# Patient Record
Sex: Male | Born: 1970 | Race: White | Hispanic: No | Marital: Married | Smoking: Never smoker
Health system: Southern US, Community
[De-identification: ages and names within clinical notes are randomized; demographics above are authoritative.]

## PROBLEM LIST (undated history)

## (undated) DIAGNOSIS — M1990 Unspecified osteoarthritis, unspecified site: Secondary | ICD-10-CM

## (undated) DIAGNOSIS — E049 Nontoxic goiter, unspecified: Secondary | ICD-10-CM

## (undated) DIAGNOSIS — E039 Hypothyroidism, unspecified: Secondary | ICD-10-CM

## (undated) HISTORY — PX: BACK SURGERY: SHX140

## (undated) HISTORY — DX: Nontoxic goiter, unspecified: E04.9

## (undated) HISTORY — DX: Unspecified osteoarthritis, unspecified site: M19.90

## (undated) HISTORY — DX: Hypothyroidism, unspecified: E03.9

## (undated) HISTORY — PX: PILONIDAL CYST EXCISION: SHX744

---

## 2000-07-15 ENCOUNTER — Encounter: Payer: Self-pay | Admitting: Rheumatology

## 2000-07-15 ENCOUNTER — Ambulatory Visit (HOSPITAL_COMMUNITY): Admission: RE | Admit: 2000-07-15 | Discharge: 2000-07-15 | Payer: Self-pay | Admitting: Rheumatology

## 2002-12-15 ENCOUNTER — Emergency Department (HOSPITAL_COMMUNITY): Admission: EM | Admit: 2002-12-15 | Discharge: 2002-12-15 | Payer: Self-pay | Admitting: Emergency Medicine

## 2004-11-08 ENCOUNTER — Ambulatory Visit (HOSPITAL_COMMUNITY): Admission: RE | Admit: 2004-11-08 | Discharge: 2004-11-08 | Payer: Self-pay | Admitting: Internal Medicine

## 2005-11-20 ENCOUNTER — Emergency Department (HOSPITAL_COMMUNITY): Admission: EM | Admit: 2005-11-20 | Discharge: 2005-11-20 | Payer: Self-pay | Admitting: Emergency Medicine

## 2005-11-27 ENCOUNTER — Emergency Department (HOSPITAL_COMMUNITY): Admission: EM | Admit: 2005-11-27 | Discharge: 2005-11-27 | Payer: Self-pay | Admitting: Emergency Medicine

## 2006-09-27 ENCOUNTER — Ambulatory Visit (HOSPITAL_COMMUNITY): Admission: RE | Admit: 2006-09-27 | Discharge: 2006-09-27 | Payer: Self-pay | Admitting: Internal Medicine

## 2008-04-14 ENCOUNTER — Ambulatory Visit (HOSPITAL_COMMUNITY): Admission: RE | Admit: 2008-04-14 | Discharge: 2008-04-14 | Payer: Self-pay | Admitting: Internal Medicine

## 2008-12-14 ENCOUNTER — Emergency Department (HOSPITAL_COMMUNITY): Admission: EM | Admit: 2008-12-14 | Discharge: 2008-12-14 | Payer: Self-pay | Admitting: Emergency Medicine

## 2010-03-07 ENCOUNTER — Ambulatory Visit (HOSPITAL_COMMUNITY): Admission: RE | Admit: 2010-03-07 | Discharge: 2010-03-07 | Payer: Self-pay | Admitting: Endocrinology

## 2010-04-26 ENCOUNTER — Ambulatory Visit (HOSPITAL_COMMUNITY)
Admission: RE | Admit: 2010-04-26 | Discharge: 2010-04-26 | Payer: Self-pay | Source: Home / Self Care | Admitting: Internal Medicine

## 2010-09-13 ENCOUNTER — Ambulatory Visit (HOSPITAL_COMMUNITY)
Admission: RE | Admit: 2010-09-13 | Discharge: 2010-09-13 | Disposition: A | Discharge status: Home / Self Care | Payer: BC Managed Care – PPO | Source: Ambulatory Visit | Attending: Neurosurgery | Admitting: Neurosurgery

## 2010-09-13 DIAGNOSIS — R262 Difficulty in walking, not elsewhere classified: Secondary | ICD-10-CM | POA: Insufficient documentation

## 2010-09-13 DIAGNOSIS — IMO0001 Reserved for inherently not codable concepts without codable children: Secondary | ICD-10-CM | POA: Insufficient documentation

## 2010-09-13 DIAGNOSIS — M6281 Muscle weakness (generalized): Secondary | ICD-10-CM | POA: Insufficient documentation

## 2010-09-13 DIAGNOSIS — M545 Low back pain, unspecified: Secondary | ICD-10-CM | POA: Insufficient documentation

## 2010-09-20 ENCOUNTER — Ambulatory Visit (HOSPITAL_COMMUNITY)
Admission: RE | Admit: 2010-09-20 | Discharge: 2010-09-20 | Disposition: A | Discharge status: Home / Self Care | Payer: BC Managed Care – PPO | Source: Ambulatory Visit | Attending: *Deleted | Admitting: *Deleted

## 2010-09-22 ENCOUNTER — Inpatient Hospital Stay (HOSPITAL_COMMUNITY)
Admission: RE | Admit: 2010-09-22 | Discharge: 2010-09-22 | Disposition: A | Discharge status: Home / Self Care | Payer: BC Managed Care – PPO | Source: Ambulatory Visit | Attending: Physical Therapy | Admitting: Physical Therapy

## 2010-09-26 ENCOUNTER — Ambulatory Visit (HOSPITAL_COMMUNITY)
Admission: RE | Admit: 2010-09-26 | Discharge: 2010-09-26 | Disposition: A | Discharge status: Home / Self Care | Payer: BC Managed Care – PPO | Source: Ambulatory Visit | Attending: Neurosurgery | Admitting: Neurosurgery

## 2010-09-26 DIAGNOSIS — M6281 Muscle weakness (generalized): Secondary | ICD-10-CM | POA: Insufficient documentation

## 2010-09-26 DIAGNOSIS — M545 Low back pain, unspecified: Secondary | ICD-10-CM | POA: Insufficient documentation

## 2010-09-26 DIAGNOSIS — IMO0001 Reserved for inherently not codable concepts without codable children: Secondary | ICD-10-CM | POA: Insufficient documentation

## 2010-09-26 DIAGNOSIS — R262 Difficulty in walking, not elsewhere classified: Secondary | ICD-10-CM | POA: Insufficient documentation

## 2010-09-28 ENCOUNTER — Ambulatory Visit (HOSPITAL_COMMUNITY): Payer: BC Managed Care – PPO | Admitting: *Deleted

## 2010-10-03 ENCOUNTER — Ambulatory Visit (HOSPITAL_COMMUNITY): Payer: BC Managed Care – PPO

## 2010-10-06 ENCOUNTER — Ambulatory Visit (HOSPITAL_COMMUNITY)
Admission: RE | Admit: 2010-10-06 | Discharge: 2010-10-06 | Disposition: A | Discharge status: Home / Self Care | Payer: BC Managed Care – PPO | Source: Ambulatory Visit | Admitting: Physical Therapy

## 2010-10-11 ENCOUNTER — Ambulatory Visit (HOSPITAL_COMMUNITY)
Admission: RE | Admit: 2010-10-11 | Discharge: 2010-10-11 | Disposition: A | Discharge status: Home / Self Care | Payer: BC Managed Care – PPO | Source: Ambulatory Visit | Admitting: Physical Therapy

## 2010-10-13 ENCOUNTER — Ambulatory Visit (HOSPITAL_COMMUNITY)
Admission: RE | Admit: 2010-10-13 | Discharge: 2010-10-13 | Disposition: A | Discharge status: Home / Self Care | Payer: BC Managed Care – PPO | Source: Ambulatory Visit | Admitting: Physical Therapy

## 2010-10-19 ENCOUNTER — Ambulatory Visit (HOSPITAL_COMMUNITY)
Admission: RE | Admit: 2010-10-19 | Discharge: 2010-10-19 | Disposition: A | Discharge status: Home / Self Care | Payer: BC Managed Care – PPO | Source: Ambulatory Visit | Attending: Internal Medicine | Admitting: Internal Medicine

## 2010-11-23 ENCOUNTER — Other Ambulatory Visit (HOSPITAL_COMMUNITY): Payer: Self-pay | Admitting: Pulmonary Disease

## 2010-11-23 ENCOUNTER — Ambulatory Visit (HOSPITAL_COMMUNITY)
Admission: RE | Admit: 2010-11-23 | Discharge: 2010-11-23 | Disposition: A | Discharge status: Home / Self Care | Payer: BC Managed Care – PPO | Source: Ambulatory Visit | Attending: Pulmonary Disease | Admitting: Pulmonary Disease

## 2010-11-23 DIAGNOSIS — R109 Unspecified abdominal pain: Secondary | ICD-10-CM | POA: Insufficient documentation

## 2010-12-22 ENCOUNTER — Ambulatory Visit (INDEPENDENT_AMBULATORY_CARE_PROVIDER_SITE_OTHER): Payer: BC Managed Care – PPO | Admitting: Urgent Care

## 2010-12-22 ENCOUNTER — Encounter: Payer: Self-pay | Admitting: Urgent Care

## 2010-12-22 DIAGNOSIS — K5909 Other constipation: Secondary | ICD-10-CM

## 2010-12-22 DIAGNOSIS — K5900 Constipation, unspecified: Secondary | ICD-10-CM

## 2010-12-22 DIAGNOSIS — R109 Unspecified abdominal pain: Secondary | ICD-10-CM | POA: Insufficient documentation

## 2010-12-22 NOTE — Patient Instructions (Signed)
Continue daily fiber supplement, metamucil, benefiber, etc Add ALIGN probiotic daily  Continue MIralax 17 grams daily for constipation-hold if loose stools Return stool specimen here ASAP  Constipation in Adults Constipation is having fewer than 2 bowel movements per week. Usually, the stools are hard. As we grow older, constipation is more common. If you try to fix constipation with laxatives, the problem may get worse. This is because laxatives taken over a long period of time make the colon muscles weaker. A low-fiber diet, not taking in enough fluids, and taking some medicines may make these problems worse. MEDICATIONS THAT MAY CAUSE CONSTIPATION  Water pills (diuretics).  Calcium channel blockers (used to control blood pressure and for the heart).   Certain pain medicines (narcotics).   Anticholinergics.  Anti-inflammatory agents.   Antacids that contain aluminum.   DISEASES THAT CONTRIBUTE TO CONSTIPATION  Diabetes.  Parkinson's disease.   Dementia.   Stroke.  Depression.   Illnesses that cause problems with salt and water metabolism.   HOME CARE INSTRUCTIONS  Constipation is usually best cared for without medicines. Increasing dietary fiber and eating more fruits and vegetables is the best way to manage constipation.   Slowly increase fiber intake to 25 to 38 grams per day. Whole grains, fruits, vegetables, and legumes are good sources of fiber. A dietitian can further help you incorporate high-fiber foods into your diet.   Drink enough water and fluids to keep your urine clear or pale yellow.   A fiber supplement may be added to your diet if you cannot get enough fiber from foods.   Increasing your activities also helps improve regularity.   Suppositories, as suggested by your caregiver, will also help. If you are using antacids, such as aluminum or calcium containing products, it will be helpful to switch to products containing magnesium if your caregiver says it  is okay.   If you have been given a liquid injection (enema) today, this is only a temporary measure. It should not be relied on for treatment of longstanding (chronic) constipation.   Stronger measures, such as magnesium sulfate, should be avoided if possible. This may cause uncontrollable diarrhea. Using magnesium sulfate may not allow you time to make it to the bathroom.  SEEK IMMEDIATE MEDICAL CARE IF:  There is bright red blood in the stool.   The constipation stays for more than 4 days.   There is belly (abdominal) or rectal pain.   You do not seem to be getting better.   You have any questions or concerns.  MAKE SURE YOU:  Understand these instructions.   Will watch your condition.   Will get help right away if you are not doing well or get worse.  Document Released: 03/09/2004 Document Re-Released: 09/05/2009 [facility] Patient Information 2011 [place], [place].

## 2010-12-22 NOTE — Progress Notes (Signed)
agree

## 2010-12-22 NOTE — Progress Notes (Signed)
Referring Provider: Carmie End), MD Primary Care Physician:  Carylon Perches, MD Primary Gastroenterologist:  Dr. Darrick Penna  Chief Complaint  Patient presents with  . Abdominal Pain    HPI:  Gordon Haley is a 40 y.o. male here as a referral from Dr. Ouida Sills for C/o gassy & abd bloating x 1 month.  CT 11/23/10->inspissated stool in colon.  Was having BM q 3days, had always been very regular.  Now back to regular daily BM.  Has been taking high fiber & miralax daily now feeling better.  Also tried mag citrate.  QAM bloating in AM, eases off throughout the day.  Denies N/V.  Started prilosec 20mg  daily-not sure if this has helped.  Back surgery 11/11 on aleve BID & vicodin that worsened constipation.  Off x 3 wks.  Now, advil 1-2 per day for HA.  Denies rectal bleeding or melena.  Started synthroid  2 weeks ago.  Wt stable.  Appetite ok.  Pain better w/ defecation.  Worst first 2 hrs AM.  Not associated w/ eating.   Past Medical History  Diagnosis Date  . Hypothyroidism   . Goiter     Past Surgical History  Procedure Date  . Back surgery   . Pilonidal cyst excision     Current Outpatient Prescriptions  Medication Sig Dispense Refill  . ibuprofen (ADVIL,MOTRIN) 200 MG tablet Take 200 mg by mouth every 6 (six) hours as needed.        Marland Kitchen levothyroxine (SYNTHROID, LEVOTHROID) 50 MCG tablet       . Nutritional Supplements (CHOICE DM FIBER-BURST PO) Take by mouth.        Marland Kitchen omeprazole (PRILOSEC) 20 MG capsule       . Simethicone (GAS RELIEF PO) Take by mouth.          Allergies as of 12/22/2010  . (No Known Allergies)    Family History  Problem Relation Age of Onset  . Cancer Father     bladder  . Lung cancer Mother   . Diabetes Brother     History   Social History  . Marital Status: Married    Spouse Name: N/A    Number of Children: 2  . Years of Education: N/A   Occupational History  . maitenance     [employer]   Social History Main Topics  . Smoking status:  Never Smoker   . Smokeless tobacco: Not on file  . Alcohol Use: Yes     occ couple beer/mo  . Drug Use: No  . Sexually Active: Not on file   Other Topics Concern  . Not on file   Social History Narrative   2 children 7/10-healthy    Review of Systems: Gen: Denies any fever, chills, sweats, anorexia, fatigue, weakness, malaise, weight loss, and sleep disorder HEENT:  Sinus drainage qAM, HAs CV: Denies chest pain, angina, palpitations, syncope, orthopnea, PND, peripheral edema, and claudication. Resp: Denies dyspnea at rest, dyspnea with exercise, cough, sputum, wheezing, coughing up blood, and pleurisy. GI: Denies vomiting blood, jaundice, and fecal incontinence.   Denies dysphagia or odynophagia. GU : Denies urinary burning, blood in urine, urinary frequency, urinary hesitancy, nocturnal urination, and urinary incontinence. MS: Denies joint pain, limitation of movement, and swelling, stiffness, low back pain, extremity pain. Denies muscle weakness, cramps, atrophy.  Derm: Denies rash, itching, dry skin, hives, moles, warts, or unhealing ulcers.  Psych: Denies depression, anxiety, memory loss, suicidal ideation, hallucinations, paranoia, and confusion.Stress w/ job change. Heme:  Denies bruising, bleeding, and enlarged lymph nodes.  Physical Exam: BP 143/79  Pulse 79  Temp(Src) 97.8 F (36.6 C) (Temporal)  Ht 5\' 11"  (1.803 m)  Wt 172 lb 3.2 oz (78.109 kg)  BMI 24.02 kg/m2 General:   Alert,  Well-developed, well-nourished, pleasant and cooperative in NAD Head:  Normocephalic and atraumatic. Eyes:  Sclera clear, no icterus.   Conjunctiva pink. Ears:  Normal auditory acuity. Nose:  No deformity, discharge,  or lesions. Mouth:  No deformity or lesions, dentition normal. Neck:  Supple; no masses or thyromegaly. Lungs:  Clear throughout to auscultation.   No wheezes, crackles, or rhonchi. No acute distress. Heart:  Regular rate and rhythm; no murmurs, clicks, rubs,  or  gallops. Abdomen:  Soft, & nondistended. Mild tenderness @ umbilicus.  No masses, hepatosplenomegaly or hernias noted. Normal bowel sounds, without guarding, and without rebound.   Rectal:  Deferred until time of colonoscopy.   Msk:  Symmetrical without gross deformities. Normal posture. Pulses:  Normal pulses noted. Extremities:  Without clubbing or edema. Neurologic:  Alert and  oriented x4;  grossly normal neurologically. Skin:  Intact without significant lesions or rashes. Cervical Nodes:  No significant cervical adenopathy. Psych:  Alert and cooperative. Normal mood and affect.

## 2010-12-22 NOTE — Assessment & Plan Note (Addendum)
Gordon Haley is a 40 y.o. caucasian male w/ recent chronic constipation in the setting of newly diagnosed thyroid disease & narcotic pain med use.  Suspect this may be culprit of abdominal pain since he has had symptomatic improvement w/ normalization of his BMs. No alarm features.   Check iFOBT.  If positive, will need colonoscopy. Follow up w/ endocrinology re TSH/thyroid Constipation literature Continue daily fiber supplement, metamucil, benefiber, etc Add ALIGN probiotic daily  Continue MIralax 17 grams daily for constipation-hold if loose stools

## 2010-12-25 NOTE — Progress Notes (Signed)
Cc to PCP 

## 2010-12-26 NOTE — Progress Notes (Signed)
LMOM to call.

## 2010-12-26 NOTE — Progress Notes (Signed)
Please call pt & have him return iFOBT. Thanks

## 2010-12-28 ENCOUNTER — Ambulatory Visit: Payer: BC Managed Care – PPO | Admitting: Urgent Care

## 2010-12-28 DIAGNOSIS — R109 Unspecified abdominal pain: Secondary | ICD-10-CM

## 2010-12-28 LAB — IFOBT (OCCULT BLOOD): IFOBT: NEGATIVE

## 2011-01-17 ENCOUNTER — Encounter: Payer: Self-pay | Admitting: Gastroenterology

## 2011-02-08 ENCOUNTER — Ambulatory Visit: Payer: BC Managed Care – PPO | Admitting: Gastroenterology

## 2011-02-08 ENCOUNTER — Telehealth: Payer: Self-pay | Admitting: Gastroenterology

## 2011-02-09 NOTE — Telephone Encounter (Signed)
noted 

## 2013-01-26 ENCOUNTER — Other Ambulatory Visit (HOSPITAL_COMMUNITY): Payer: Self-pay | Admitting: Otolaryngology

## 2013-01-26 DIAGNOSIS — E079 Disorder of thyroid, unspecified: Secondary | ICD-10-CM

## 2013-02-05 ENCOUNTER — Ambulatory Visit (HOSPITAL_COMMUNITY): Payer: BC Managed Care – PPO | Attending: Otolaryngology

## 2013-02-05 ENCOUNTER — Ambulatory Visit (HOSPITAL_COMMUNITY): Admission: RE | Admit: 2013-02-05 | Payer: BC Managed Care – PPO | Source: Ambulatory Visit

## 2013-03-05 ENCOUNTER — Ambulatory Visit (HOSPITAL_COMMUNITY)
Admission: RE | Admit: 2013-03-05 | Discharge: 2013-03-05 | Disposition: A | Discharge status: Home / Self Care | Payer: BC Managed Care – PPO | Source: Ambulatory Visit | Attending: Otolaryngology | Admitting: Otolaryngology

## 2013-03-05 DIAGNOSIS — E079 Disorder of thyroid, unspecified: Secondary | ICD-10-CM

## 2013-03-05 DIAGNOSIS — E049 Nontoxic goiter, unspecified: Secondary | ICD-10-CM | POA: Insufficient documentation

## 2013-03-05 MED ORDER — LIDOCAINE HCL (PF) 2 % IJ SOLN
INTRAMUSCULAR | Status: AC
  Filled 2013-03-05: qty 10

## 2013-03-05 MED ORDER — LIDOCAINE HCL (PF) 2 % IJ SOLN
10.0000 mL | Freq: Once | INTRAMUSCULAR | Status: AC
  Administered 2013-03-05: 10 mL
  Filled 2013-03-05: qty 10

## 2013-03-05 NOTE — Progress Notes (Signed)
Pt for right thyroid biopsy with Dr.Boles. Pt tolerating procedure no signs of distress.

## 2013-03-05 NOTE — Procedures (Signed)
PreOperative Dx: RIGHT thyroid mass Postoperative Dx: RIGHT thyroid mass Procedure:   US guided FNA of RIGHT thyroid nodule Radiologist:  Tyron Russell Anesthesia:  1 ml of 2 lidocaine Specimen:  FNA x 3  EBL:   None Complications: None

## 2013-09-07 ENCOUNTER — Encounter (INDEPENDENT_AMBULATORY_CARE_PROVIDER_SITE_OTHER): Payer: Self-pay | Admitting: General Surgery

## 2013-09-07 ENCOUNTER — Ambulatory Visit (INDEPENDENT_AMBULATORY_CARE_PROVIDER_SITE_OTHER): Payer: PRIVATE HEALTH INSURANCE | Admitting: General Surgery

## 2013-09-07 VITALS — BP 142/90 | HR 78 | Temp 99.0°F | Resp 14 | Ht 71.0 in | Wt 174.2 lb

## 2013-09-07 DIAGNOSIS — K6289 Other specified diseases of anus and rectum: Secondary | ICD-10-CM

## 2013-09-07 NOTE — Progress Notes (Signed)
Chief complaint: Perianal irritation and discomfort  History: Patient is a generally healthy 43 year old male. I know him do to a pilonidal cyst excision 18 years ago. He states that about 2 weeks ago he developed an area of what felt like a skin eructation and tenderness just anterior to his anus. He initially tried some barrier cream is without success. Cornstarch was recommended and he's been using this for about a week and feels he is 50% better than he was. He still feels that the skin is irritated or tender when the cornstarch is soft. He has not had any deep pain or drainage. He says it did feel somewhat swollen to him initially but not now. No previous history of any perianal infections. No pain with bowel movements or bleeding  Past Medical History  Diagnosis Date  . Hypothyroidism   . Goiter   . Arthritis    Past Surgical History  Procedure Laterality Date  . Back surgery    . Pilonidal cyst excision     Exam: BP 142/90  Pulse 78  Temp(Src) 99 F (37.2 C) (Temporal)  Resp 14  Ht 5\' 11"  (1.803 m)  Wt 174 lb 3.2 oz (79.017 kg)  BMI 24.31 kg/m2 General: Healthy-appearing Caucasian male in no distress Current exam Limited to anorectal exam. There may be some minimal erythema to the skin just anterior to the anus a centimeter or 2. No fullness or mass or tenderness. No skin openings were drainage.  Assessment and plan: Perianal discomfort. Etiology is not clear. He may have had some skin irritation which is improving. This could conceivably have been a perianal abscess that is resolving spontaneously. I cannot really find anything specific on exam today and he is steadily improving with his current regimen. I recommended continuing this and to contact me if he feels worse at any point or if it has not resolved in the next 2 weeks.

## 2015-02-01 ENCOUNTER — Other Ambulatory Visit (HOSPITAL_COMMUNITY): Payer: Self-pay | Admitting: Endocrinology

## 2015-02-01 DIAGNOSIS — E042 Nontoxic multinodular goiter: Secondary | ICD-10-CM

## 2015-02-09 ENCOUNTER — Ambulatory Visit (HOSPITAL_COMMUNITY)
Admission: RE | Admit: 2015-02-09 | Discharge: 2015-02-09 | Disposition: A | Discharge status: Home / Self Care | Payer: PRIVATE HEALTH INSURANCE | Source: Ambulatory Visit | Attending: Endocrinology | Admitting: Endocrinology

## 2015-02-09 DIAGNOSIS — E042 Nontoxic multinodular goiter: Secondary | ICD-10-CM

## 2015-02-09 DIAGNOSIS — E079 Disorder of thyroid, unspecified: Secondary | ICD-10-CM | POA: Insufficient documentation

## 2015-11-23 ENCOUNTER — Ambulatory Visit: Payer: Self-pay | Admitting: Family Medicine

## 2015-12-02 ENCOUNTER — Encounter: Payer: Self-pay | Admitting: Family Medicine

## 2015-12-02 ENCOUNTER — Ambulatory Visit (INDEPENDENT_AMBULATORY_CARE_PROVIDER_SITE_OTHER): Payer: PRIVATE HEALTH INSURANCE | Admitting: Family Medicine

## 2015-12-02 VITALS — BP 114/77 | HR 71 | Temp 97.0°F | Ht 71.0 in | Wt 173.0 lb

## 2015-12-02 DIAGNOSIS — Z7689 Persons encountering health services in other specified circumstances: Secondary | ICD-10-CM

## 2015-12-02 DIAGNOSIS — Z0000 Encounter for general adult medical examination without abnormal findings: Secondary | ICD-10-CM | POA: Diagnosis not present

## 2015-12-02 DIAGNOSIS — E039 Hypothyroidism, unspecified: Secondary | ICD-10-CM

## 2015-12-02 NOTE — Patient Instructions (Signed)
Continue current medications. Continue good therapeutic lifestyle changes which include good diet and exercise. Fall precautions discussed with patient. If an FOBT was given today- please return it to our front desk. If you are over 44 years old - you may need Prevnar 13 or the adult Pneumonia vaccine.   After your visit with us today you will receive a survey in the mail or online from [employer] regarding your care with us. Please take a moment to fill this out. Your feedback is very important to us as you can help us better understand your patient needs as well as improve your experience and satisfaction. WE CARE ABOUT YOU!!!    

## 2015-12-02 NOTE — Progress Notes (Signed)
   Subjective:    Patient ID: Gordon DubinCharles Haley, male    DOB: [birth year], 45 y.o.   MRN: [id number]  HPI Patient is New and establishing care here today. Patient is here today for annual wellness exam and follow up of chronic medical problems which includes hypothyroid. He is taking medication regularly. His only concern is a cyst on his low back. It has been seen by dermatologist before but no intervention. He had labs done through his employer which are basically normal. Blood sugar is good cholesterol and lipids are within normal limits BMI is normal. His waist circumference is upper limits of normal and other labs including liver function tests CBC renal function uric acid are all normal. He does have a family history of diabetes, but again his fasting blood sugar was normal     Patient Active Problem List   Diagnosis Date Noted  . Abdominal pain 12/22/2010  . Chronic constipation 12/22/2010   Outpatient Encounter Prescriptions as of 12/02/2015  Medication Sig  . levothyroxine (SYNTHROID, LEVOTHROID) 50 MCG tablet    No facility-administered encounter medications on file as of 12/02/2015.        Review of Systems  Constitutional: Negative.   HENT: Negative.   Eyes: Negative.   Respiratory: Negative.   Cardiovascular: Negative.   Gastrointestinal: Negative.   Endocrine: Negative.   Genitourinary: Negative.   Musculoskeletal: Negative.   Skin: Negative.        Cyst low back  Allergic/Immunologic: Negative.   Neurological: Negative.   Hematological: Negative.   Psychiatric/Behavioral: Negative.        Objective:   Physical Exam  Constitutional: He is oriented to person, place, and time. He appears well-developed and well-nourished.  HENT:  Head: Normocephalic.  Right Ear: External ear normal.  Left Ear: External ear normal.  Nose: Nose normal.  Mouth/Throat: Oropharynx is clear and moist.  Eyes: Conjunctivae and EOM are normal. Pupils are equal, round, and reactive  to light.  Neck: Normal range of motion. Neck supple.  Cardiovascular: Normal rate, regular rhythm, normal heart sounds and intact distal pulses.   Pulmonary/Chest: Effort normal and breath sounds normal.  Abdominal: Soft. Bowel sounds are normal.  Musculoskeletal: Normal range of motion.  Neurological: He is alert and oriented to person, place, and time.  Skin: Skin is warm and dry.  There is a marble sized cyst in his low back on the left side. This is consistent with a sebaceous cyst.  Psychiatric: He has a normal mood and affect. His behavior is normal. Judgment and thought content normal.   BP 114/77 mmHg  Pulse 71  Temp(Src) 97 F (36.1 C) (Oral)  Ht 5\' 11"  (1.803 m)  Wt 173 lb (78.472 kg)  BMI 24.14 kg/m2        Assessment & Plan:  1. Annual physical exam Physical exam is within normal limits  2. Establishing care with new doctor, encounter for He also is followed by endocrinology for his hypothyroidism and goiter.  3. Hypothyroidism, unspecified hypothyroidism type Will not check TSH today ;appointment with endocrinology

## 2016-07-25 ENCOUNTER — Other Ambulatory Visit (HOSPITAL_COMMUNITY): Payer: Self-pay | Admitting: General Surgery

## 2016-07-25 DIAGNOSIS — N50811 Right testicular pain: Secondary | ICD-10-CM

## 2016-07-30 ENCOUNTER — Ambulatory Visit (HOSPITAL_COMMUNITY)
Admission: RE | Admit: 2016-07-30 | Discharge: 2016-07-30 | Disposition: A | Discharge status: Home / Self Care | Payer: PRIVATE HEALTH INSURANCE | Source: Ambulatory Visit | Attending: General Surgery | Admitting: General Surgery

## 2016-07-30 DIAGNOSIS — N50811 Right testicular pain: Secondary | ICD-10-CM | POA: Diagnosis not present

## 2016-07-30 MED ORDER — IOPAMIDOL (ISOVUE-300) INJECTION 61%
100.0000 mL | Freq: Once | INTRAVENOUS | Status: AC | PRN
  Administered 2016-07-30: 100 mL via INTRAVENOUS

## 2016-08-08 ENCOUNTER — Encounter: Payer: Self-pay | Admitting: *Deleted

## 2016-09-26 ENCOUNTER — Ambulatory Visit: Payer: PRIVATE HEALTH INSURANCE | Admitting: Neurology

## 2016-09-30 ENCOUNTER — Encounter (HOSPITAL_COMMUNITY): Payer: Self-pay | Admitting: *Deleted

## 2016-09-30 ENCOUNTER — Emergency Department (HOSPITAL_COMMUNITY): Payer: PRIVATE HEALTH INSURANCE

## 2016-09-30 ENCOUNTER — Emergency Department (HOSPITAL_COMMUNITY)
Admission: EM | Admit: 2016-09-30 | Discharge: 2016-09-30 | Disposition: A | Discharge status: Home / Self Care | Payer: PRIVATE HEALTH INSURANCE | Attending: Emergency Medicine | Admitting: Emergency Medicine

## 2016-09-30 DIAGNOSIS — E039 Hypothyroidism, unspecified: Secondary | ICD-10-CM | POA: Insufficient documentation

## 2016-09-30 DIAGNOSIS — R0789 Other chest pain: Secondary | ICD-10-CM

## 2016-09-30 DIAGNOSIS — R072 Precordial pain: Secondary | ICD-10-CM | POA: Insufficient documentation

## 2016-09-30 DIAGNOSIS — Z79899 Other long term (current) drug therapy: Secondary | ICD-10-CM | POA: Insufficient documentation

## 2016-09-30 LAB — COMPREHENSIVE METABOLIC PANEL
ALBUMIN: 4.2 g/dL (ref 3.5–5.0)
ALT: 24 U/L (ref 17–63)
AST: 24 U/L (ref 15–41)
Alkaline Phosphatase: 51 U/L (ref 38–126)
Anion gap: 9 (ref 5–15)
BILIRUBIN TOTAL: 0.9 mg/dL (ref 0.3–1.2)
BUN: 14 mg/dL (ref 6–20)
CO2: 28 mmol/L (ref 22–32)
CREATININE: 0.8 mg/dL (ref 0.61–1.24)
Calcium: 9.2 mg/dL (ref 8.9–10.3)
Chloride: 102 mmol/L (ref 101–111)
GFR calc Af Amer: >60 mL/min (ref >60)
GFR calc non Af Amer: >60 mL/min (ref >60)
GLUCOSE: 109 mg/dL — AB (ref 65–99)
POTASSIUM: 3.5 mmol/L (ref 3.5–5.1)
Sodium: 139 mmol/L (ref 135–145)
TOTAL PROTEIN: 7.4 g/dL (ref 6.5–8.1)

## 2016-09-30 LAB — CBC WITH DIFFERENTIAL/PLATELET
BASOS ABS: 0.1 10*3/uL (ref 0.0–0.1)
BASOS PCT: 0 %
Eosinophils Absolute: 0.1 10*3/uL (ref 0.0–0.7)
Eosinophils Relative: 1 %
HEMATOCRIT: 46 % (ref 39.0–52.0)
HEMOGLOBIN: 16 g/dL (ref 13.0–17.0)
Lymphocytes Relative: 17 %
Lymphs Abs: 2.1 10*3/uL (ref 0.7–4.0)
MCH: 30.2 pg (ref 26.0–34.0)
MCHC: 34.8 g/dL (ref 30.0–36.0)
MCV: 87 fL (ref 78.0–100.0)
MONOS PCT: 6 %
Monocytes Absolute: 0.8 10*3/uL (ref 0.1–1.0)
NEUTROS ABS: 9.4 10*3/uL — AB (ref 1.7–7.7)
Neutrophils Relative %: 76 %
Platelets: 254 10*3/uL (ref 150–400)
RBC: 5.29 MIL/uL (ref 4.22–5.81)
RDW: 12.6 % (ref 11.5–15.5)
WBC: 12.4 10*3/uL — ABNORMAL HIGH (ref 4.0–10.5)

## 2016-09-30 LAB — TROPONIN I: Troponin I: <0.03 ng/mL (ref <0.03)

## 2016-09-30 MED ORDER — ASPIRIN 325 MG PO TABS
325.0000 mg | ORAL_TABLET | Freq: Once | ORAL | Status: AC
  Administered 2016-09-30: 325 mg via ORAL
  Filled 2016-09-30: qty 1

## 2016-09-30 NOTE — ED Provider Notes (Signed)
[facility]-EMERGENCY DEPT Provider Note   CSN: [id number] Arrival date & time: 09/30/16  1931     History   Chief Complaint Chief Complaint  Patient presents with  . Chest Pain    HPI Avrian Delfavero is a 46 y.o. male.  Patient states he was walking and had some mild chest tightness and pain in his neck. He's been having neck pain for quite some time and has been seen by family doctor in chiropractor   The history is provided by the patient. No language interpreter was used.  Chest Pain   This is a new problem. The current episode started 6 to 12 hours ago. The problem occurs rarely. The problem has been resolved. The pain is associated with walking. The pain is present in the substernal region. The pain is at a severity of 3/10. The quality of the pain is described as brief. The pain does not radiate. Pertinent negatives include no abdominal pain, no back pain, no cough and no headaches.  Pertinent negatives for past medical history include no seizures.    Past Medical History:  Diagnosis Date  . Arthritis   . Goiter   . Hypothyroidism     Patient Active Problem List   Diagnosis Date Noted  . Abdominal pain 12/22/2010  . Chronic constipation 12/22/2010    Past Surgical History:  Procedure Laterality Date  . BACK SURGERY     2011  . PILONIDAL CYST EXCISION         Home Medications    Prior to Admission medications   Medication Sig Start Date End Date Taking? Authorizing Provider  levothyroxine (SYNTHROID, LEVOTHROID) 50 MCG tablet  12/17/10   Historical Provider, MD    Family History Family History  Problem Relation Age of Onset  . Cancer Father     bladder  . Lung cancer Mother   . Diabetes Brother     Social History Social History  Substance Use Topics  . Smoking status: Never Smoker  . Smokeless tobacco: Never Used  . Alcohol use Yes     Comment: occ couple beer/mo     Allergies   Patient has no known allergies.   Review of Systems Review  of Systems  Constitutional: Negative for appetite change and fatigue.  HENT: Negative for congestion, ear discharge and sinus pressure.   Eyes: Negative for discharge.  Respiratory: Negative for cough.   Cardiovascular: Positive for chest pain.  Gastrointestinal: Negative for abdominal pain and diarrhea.  Genitourinary: Negative for frequency and hematuria.  Musculoskeletal: Negative for back pain.  Skin: Negative for rash.  Neurological: Negative for seizures and headaches.  Psychiatric/Behavioral: Negative for hallucinations.     Physical Exam Updated Vital Signs BP 126/84   Pulse 76   Temp 98.3 F (36.8 C) (Oral)   Resp 18   Ht  (1.803 m)   Wt 176 lb (79.8 kg)   SpO2 96%   BMI 24.55 kg/m   Physical Exam  Constitutional: He is oriented to person, place, and time. He appears well-developed.  HENT:  Head: Normocephalic.  Eyes: Conjunctivae and EOM are normal. No scleral icterus.  Neck: Neck supple. No thyromegaly present.  Cardiovascular: Normal rate and regular rhythm.  Exam reveals no gallop and no friction rub.   No murmur heard. Pulmonary/Chest: No stridor. He has no wheezes. He has no rales. He exhibits no tenderness.  Abdominal: He exhibits no distension. There is no tenderness. There is no rebound.  Musculoskeletal: Normal range of motion.  He exhibits no edema.  Lymphadenopathy:    He has no cervical adenopathy.  Neurological: He is oriented to person, place, and time. He exhibits normal muscle tone. Coordination normal.  Skin: No rash noted. No erythema.  Psychiatric: He has a normal mood and affect. His behavior is normal.     ED Treatments / Results  Labs (all labs ordered are listed, but only abnormal results are displayed) Labs Reviewed  CBC WITH DIFFERENTIAL/PLATELET - Abnormal; Notable for the following:       Result Value   WBC 12.4 (*)    Neutro Abs 9.4 (*)    All other components within normal limits  COMPREHENSIVE METABOLIC PANEL -  Abnormal; Notable for the following:    Glucose, Bld 109 (*)    All other components within normal limits  TROPONIN I    EKG  EKG Interpretation  Date/Time:  Sunday September 30 2016 19:49:33 EDT Ventricular Rate:  78 PR Interval:    QRS Duration: 117 QT Interval:  362 QTC Calculation: 413 R Axis:   70 Text Interpretation:  Sinus rhythm Incomplete right bundle branch block ST elev, probable normal early repol pattern Baseline wander in lead(s) V3 Confirmed by Dashiel Bergquist  MD, Shawan Tosh [phone]) on 09/30/2016 10:36:24 PM       Radiology Dg Chest 2 View  Result Date: 09/30/2016 CLINICAL DATA:  Acute onset of neck pain, generalized weakness, shortness of breath and chest discomfort extending to the left arm. Initial encounter. EXAM: CHEST  2 VIEW COMPARISON:  Chest radiograph performed 08/18/2012 FINDINGS: The lungs are well-aerated and clear. There is no evidence of focal opacification, pleural effusion or pneumothorax. The heart is normal in size; the mediastinal contour is within normal limits. No acute osseous abnormalities are seen. IMPRESSION: No acute cardiopulmonary process seen. Electronically Signed   By: Roanna Raider M.D.   On: 09/30/2016 22:19    Procedures Procedures (including critical care time)  Medications Ordered in ED Medications  aspirin tablet 325 mg (not administered)     Initial Impression / Assessment and Plan / ED Course  I have reviewed the triage vital signs and the nursing notes.  Pertinent labs & imaging results that were available during my care of the patient were reviewed by me and considered in my medical decision making (see chart for details).     EKG and labs unremarkable. Patient has no risk factors except for family history. Doubt this is cardiac artery disease but patient will be referred to cardiology and started on a baby aspirin a day  Final Clinical Impressions(s) / ED Diagnoses   Final diagnoses:  Atypical chest pain    New  Prescriptions New Prescriptions   No medications on file     Bethann Berkshire, MD 09/30/16 2243

## 2016-09-30 NOTE — ED Notes (Signed)
Patient transported to X-ray 

## 2016-09-30 NOTE — Discharge Instructions (Signed)
Follow up with Dr. Purvis Sheffield or one of his partners in 1 week.  Call tomorrow for an appointment.  Tell them you were referred from the ER.  Return if problems.  Take one baby aspirin a day

## 2016-09-30 NOTE — ED Triage Notes (Addendum)
Pt reports being at work on Friday and had sudden sharp shooting pain in his neck. Pt reports getting a "weird" taste in his mouth and became nauseated. Pt saw his PCP for it on Friday and was told he was having muscle spasms and was given a muscle relaxer with some relief. Today pt was walking a trail and pt began having pains in his neck, feeling really weak, sob, and chest discomfort that radiated down his let arm.

## 2016-10-11 ENCOUNTER — Ambulatory Visit: Payer: PRIVATE HEALTH INSURANCE | Admitting: Neurology

## 2016-10-18 ENCOUNTER — Encounter: Payer: Self-pay | Admitting: Cardiology

## 2016-10-18 ENCOUNTER — Ambulatory Visit (INDEPENDENT_AMBULATORY_CARE_PROVIDER_SITE_OTHER): Payer: PRIVATE HEALTH INSURANCE | Admitting: Cardiology

## 2016-10-18 VITALS — BP 144/92 | HR 94 | Ht 71.0 in | Wt 171.0 lb

## 2016-10-18 DIAGNOSIS — R0789 Other chest pain: Secondary | ICD-10-CM

## 2016-10-18 MED ORDER — ASPIRIN EC 81 MG PO TBEC: 81.0000 mg | DELAYED_RELEASE_TABLET | Freq: Every day | ORAL | 3 refills | Status: DC

## 2016-10-18 NOTE — Patient Instructions (Signed)
Your physician recommends that you schedule a follow-up appointment pending test results.   Your physician recommends that you continue on your current medications as directed. Please refer to the Current Medication list given to you today.  Start Aspirin 81 mg Daily   Your physician has requested that you have an exercise tolerance test. For further information please visit [email or web address] Please also follow instruction sheet, as given.   If you need a refill on your cardiac medications before your next appointment, please call your pharmacy.  Thank you for choosing [facility]!

## 2016-10-18 NOTE — Progress Notes (Signed)
     Clinical Summary [name] is a 46 y.o.male seen as new patient, referred by Dr Ouida Sills for chest pain.  1. Chest pain - symptoms started early April. Occurred while walking. He reports a few days prior had a sharp pain in neck and nausea, weakness. Seen by pcp, thought to be muscle spasm. Given muscle relaxer with improvement - few days later while walking his regular 3 mile walk. Similar pain in neck, fatigue. Midchest tightness, 2/10. Mild SOB. Not positional. Pain lasted just a few seconds.  - ER visit 09/30/16 with chest pain. Negative workup for ACS, discharged home  - no repeat episodes. Has limited activity some. No SOB or DOE. NO recurrent neck pain.  - CAD risk factors: father CABG mid 89s       Past Medical History:  Diagnosis Date  . Arthritis   . Goiter   . Hypothyroidism      No Known Allergies   Current Outpatient Prescriptions  Medication Sig Dispense Refill  . levothyroxine (SYNTHROID, LEVOTHROID) 50 MCG tablet      No current facility-administered medications for this visit.      Past Surgical History:  Procedure Laterality Date  . BACK SURGERY     2011  . PILONIDAL CYST EXCISION       No Known Allergies    Family History  Problem Relation Age of Onset  . Cancer Father     bladder  . Lung cancer Mother   . Diabetes Brother      Social History Gordon Haley reports that he has never smoked. He has never used smokeless tobacco. Gordon Haley reports that he drinks alcohol.   Review of Systems CONSTITUTIONAL: No weight loss, fever, chills, weakness or fatigue.  HEENT: Eyes: No visual loss, blurred vision, double vision or yellow sclerae.No hearing loss, sneezing, congestion, runny nose or sore throat.  SKIN: No rash or itching.  CARDIOVASCULAR: peer hpi RESPIRATORY: No shortness of breath, cough or sputum.  GASTROINTESTINAL: No anorexia, nausea, vomiting or diarrhea. No abdominal pain or blood.  GENITOURINARY: No burning on  urination, no polyuria NEUROLOGICAL: No headache, dizziness, syncope, paralysis, ataxia, numbness or tingling in the extremities. No change in bowel or bladder control.  MUSCULOSKELETAL: No muscle, back pain, joint pain or stiffness.  LYMPHATICS: No enlarged nodes. No history of splenectomy.  PSYCHIATRIC: No history of depression or anxiety.  ENDOCRINOLOGIC: No reports of sweating, cold or heat intolerance. No polyuria or polydipsia.  Marland Kitchen   Physical Examination Vitals:   10/18/16 0814 10/18/16 0818  BP: (!) 142/84 (!) 144/92  Pulse: 94    Vitals:   10/18/16 0814  Weight: 171 lb (77.6 kg)  Height:  (1.803 m)    Gen: resting comfortably, no acute distress HEENT: no scleral icterus, pupils equal round and reactive, no palptable cervical adenopathy,  CV: RRR, no m/r/g, no jvd Resp: Clear to auscultation bilaterally GI: abdomen is soft, non-tender, non-distended, normal bowel sounds, no hepatosplenomegaly MSK: extremities are warm, no edema.  Skin: warm, no rash Neuro:  no focal deficits Psych: appropriate affect    Assessment and Plan  1. Chest pain - we will obtain a GXT to further evaluate his exertional symptoms.  - start ASA  daily.    F/u pending stress results      Antoine Poche, M.D.

## 2016-10-19 ENCOUNTER — Ambulatory Visit (HOSPITAL_COMMUNITY)
Admission: RE | Admit: 2016-10-19 | Discharge: 2016-10-19 | Disposition: A | Discharge status: Home / Self Care | Payer: PRIVATE HEALTH INSURANCE | Source: Ambulatory Visit | Attending: Cardiology | Admitting: Cardiology

## 2016-10-19 ENCOUNTER — Ambulatory Visit (HOSPITAL_COMMUNITY): Payer: PRIVATE HEALTH INSURANCE | Admitting: Physical Therapy

## 2016-10-19 DIAGNOSIS — R072 Precordial pain: Secondary | ICD-10-CM | POA: Insufficient documentation

## 2016-10-19 LAB — EXERCISE TOLERANCE TEST
CHL RATE OF PERCEIVED EXERTION: 17
CSEPED: 10 min
CSEPEDS: 2 s
CSEPHR: 92 %
Estimated workload: 13.4 METS
MPHR: 176 {beats}/min
Peak HR: 162 {beats}/min
Rest HR: 81 {beats}/min

## 2016-10-24 ENCOUNTER — Encounter (HOSPITAL_COMMUNITY): Payer: PRIVATE HEALTH INSURANCE

## 2016-10-24 ENCOUNTER — Ambulatory Visit (HOSPITAL_COMMUNITY): Payer: PRIVATE HEALTH INSURANCE

## 2016-10-30 ENCOUNTER — Ambulatory Visit: Payer: PRIVATE HEALTH INSURANCE | Admitting: Neurology

## 2016-10-31 ENCOUNTER — Telehealth (HOSPITAL_COMMUNITY): Payer: Self-pay | Admitting: Physical Therapy

## 2016-10-31 NOTE — Telephone Encounter (Signed)
Pt states this is not a MVA and he will call back in the morning to let us know if  he will keep this apptment or not after the talks to the MD. NF 10/31/16

## 2016-11-01 ENCOUNTER — Ambulatory Visit (HOSPITAL_COMMUNITY): Payer: PRIVATE HEALTH INSURANCE | Admitting: Physical Therapy

## 2016-11-06 ENCOUNTER — Ambulatory Visit (HOSPITAL_COMMUNITY): Payer: PRIVATE HEALTH INSURANCE | Attending: Internal Medicine | Admitting: Physical Therapy

## 2016-11-06 ENCOUNTER — Telehealth (HOSPITAL_COMMUNITY): Payer: Self-pay | Admitting: Internal Medicine

## 2016-11-06 NOTE — Telephone Encounter (Signed)
11/06/16 pt called at 11:43 to say he wouldn't be able to keep his afternoon appt.  He said he had to work and would call back to reschedule

## 2016-12-04 ENCOUNTER — Ambulatory Visit: Payer: PRIVATE HEALTH INSURANCE | Admitting: Family Medicine

## 2016-12-06 ENCOUNTER — Ambulatory Visit: Payer: PRIVATE HEALTH INSURANCE | Admitting: Family Medicine

## 2017-01-07 ENCOUNTER — Ambulatory Visit: Payer: Self-pay | Admitting: Family Medicine

## 2017-01-07 ENCOUNTER — Encounter: Payer: PRIVATE HEALTH INSURANCE | Admitting: Family Medicine

## 2017-01-08 ENCOUNTER — Encounter: Payer: Self-pay | Admitting: Family Medicine

## 2017-04-16 ENCOUNTER — Other Ambulatory Visit (HOSPITAL_COMMUNITY): Payer: Self-pay | Admitting: Endocrinology

## 2017-04-16 DIAGNOSIS — E042 Nontoxic multinodular goiter: Secondary | ICD-10-CM

## 2017-04-22 ENCOUNTER — Ambulatory Visit (HOSPITAL_COMMUNITY)
Admission: RE | Admit: 2017-04-22 | Discharge: 2017-04-22 | Disposition: A | Discharge status: Home / Self Care | Payer: PRIVATE HEALTH INSURANCE | Source: Ambulatory Visit | Attending: Endocrinology | Admitting: Endocrinology

## 2017-04-22 DIAGNOSIS — E042 Nontoxic multinodular goiter: Secondary | ICD-10-CM | POA: Insufficient documentation

## 2017-05-16 ENCOUNTER — Other Ambulatory Visit: Payer: Self-pay | Admitting: General Surgery

## 2017-07-14 IMAGING — CT CT PELVIS W/ CM
2 of 4 series · 16 of 46 positions shown, 18 images · IV contrast (Isovue)
Comparison: CT 11/23/2010

CLINICAL DATA: Oral contrast per protocol; rt testicular pain since
November 2015; left thigh numbness; no other complaints; hx low back
surg^100mL OBMCIH-TGG IOPAMIDOL (OBMCIH-TGG) INJECTION 61%

EXAM:
CT PELVIS WITH CONTRAST
TECHNIQUE: Multidetector CT imaging of the pelvis was performed using the
standard protocol following the bolus administration of intravenous
contrast.
CONTRAST:  100mL OBMCIH-TGG IOPAMIDOL (OBMCIH-TGG) INJECTION 61%

[Series 2: axial st · axial · 0.82mm/px · z∈[-610,-350]mm · 13 of 60 slices shown, 15 images]
[im 4/60  soft-tissue]
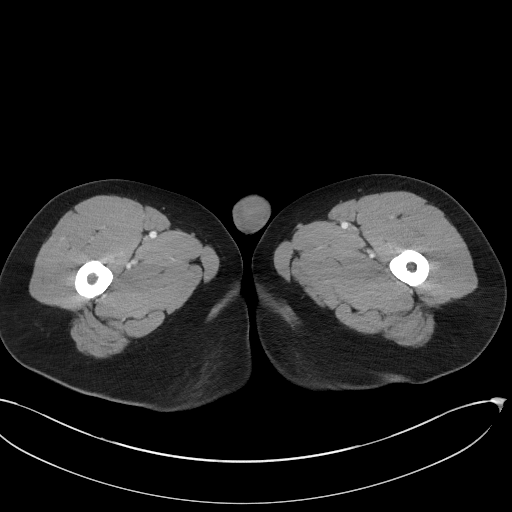
[im 4/60  bone]
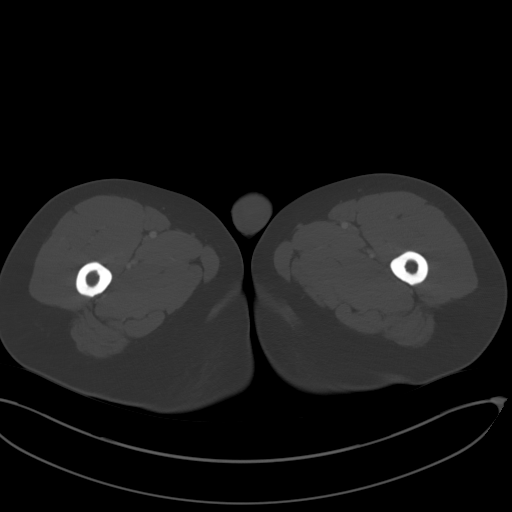
[im 8/60  soft-tissue]
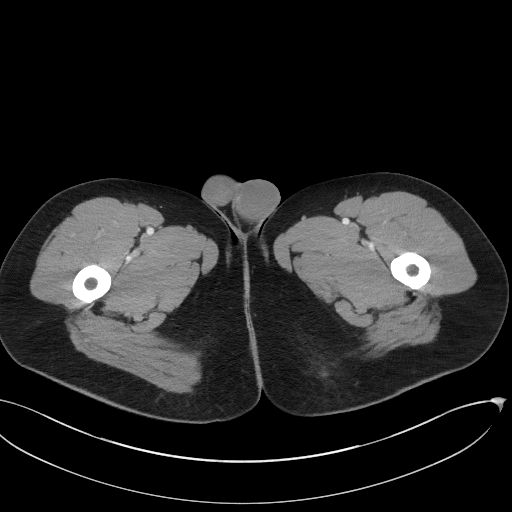
[im 12/60  soft-tissue]
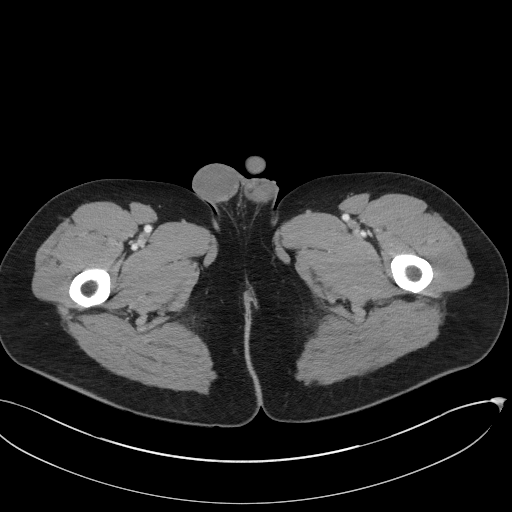
[im 16/60  soft-tissue]
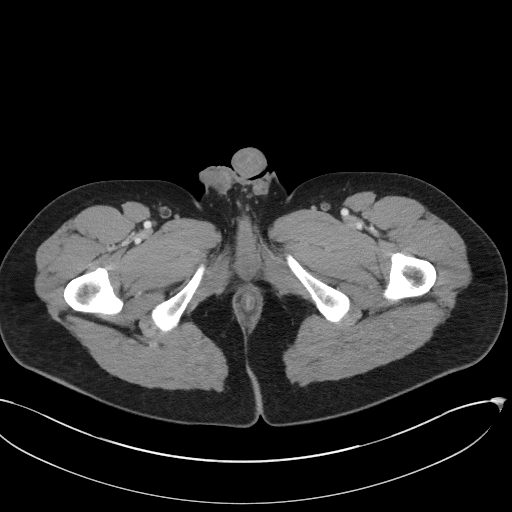
[im 20/60  soft-tissue]
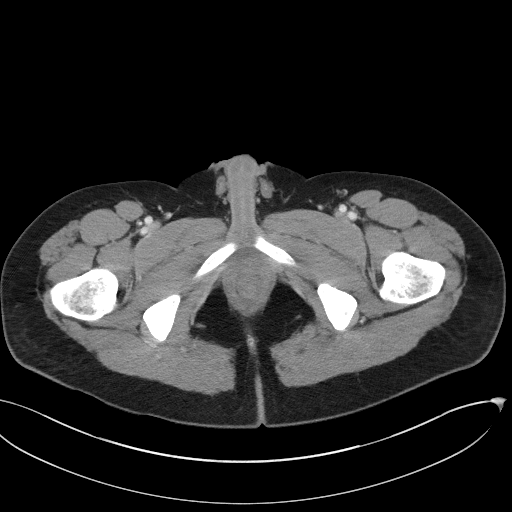
[im 24/60  soft-tissue]
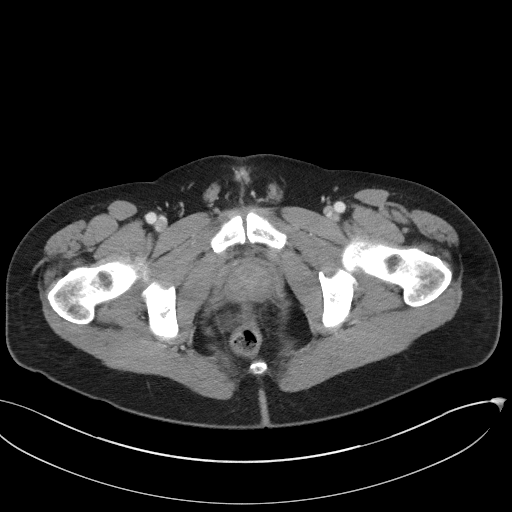
[im 32/60  soft-tissue]
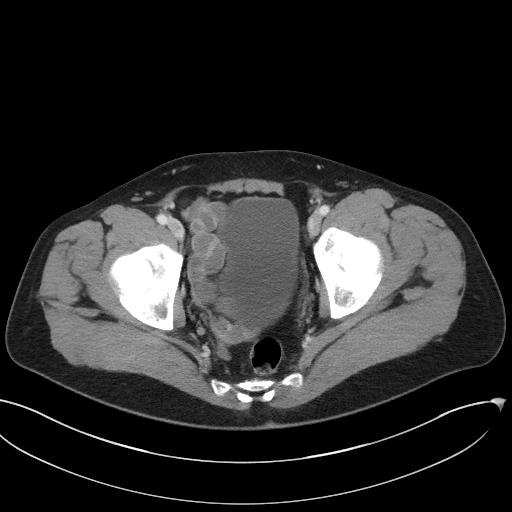
[im 36/60  soft-tissue]
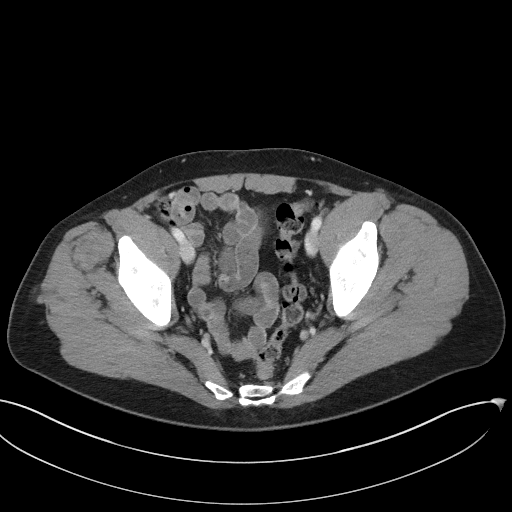
[im 40/60  soft-tissue]
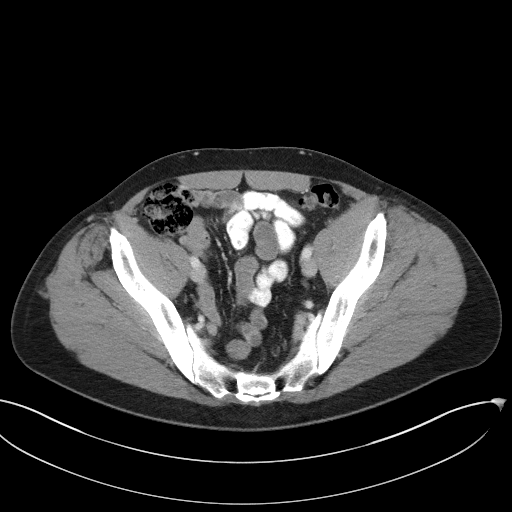
[im 40/60  bone]
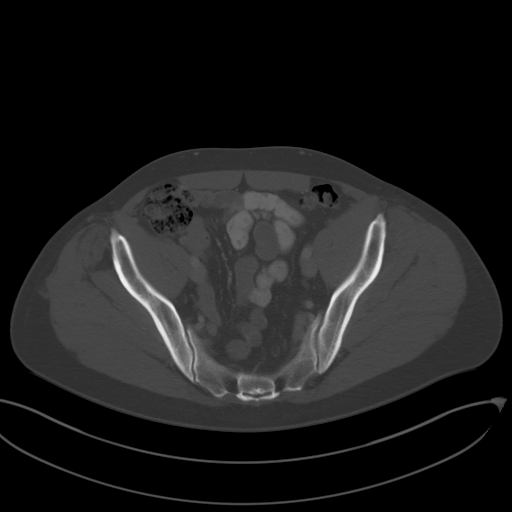
[im 44/60  soft-tissue]
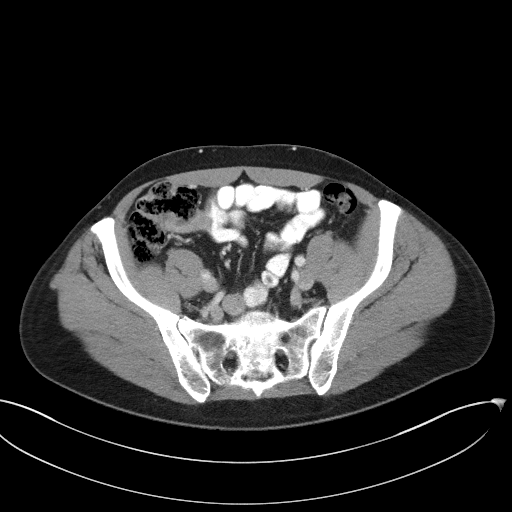
[im 48/60  soft-tissue]
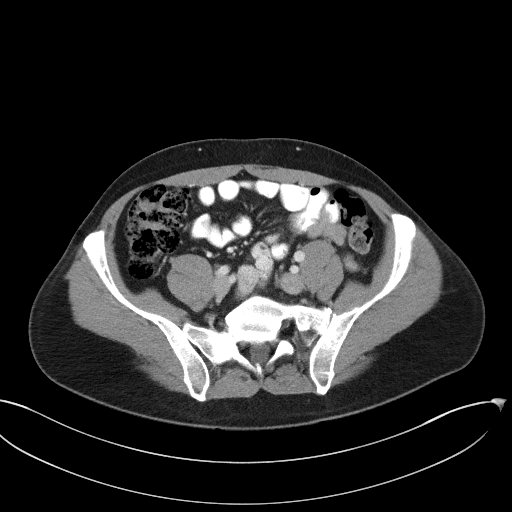
[im 52/60  soft-tissue]
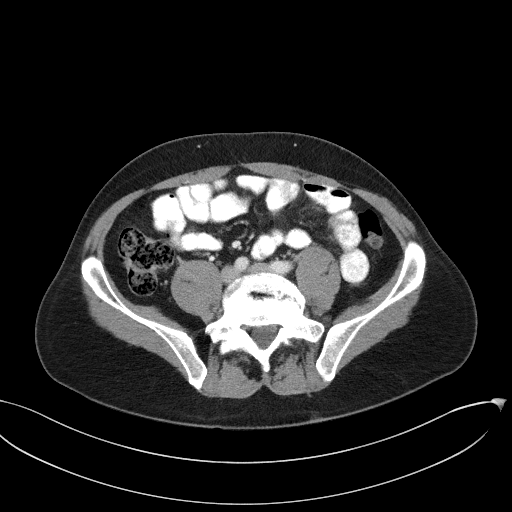
[im 56/60  soft-tissue]
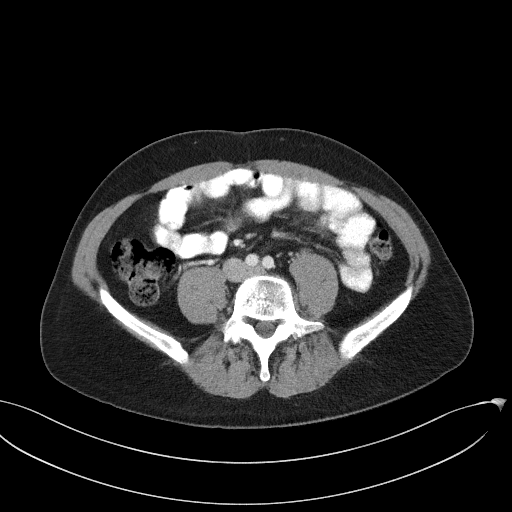

[Series 5: coronal st · coronal · 0.58mm/px · 3 of 81 slices shown]
[im 27/81  soft-tissue]
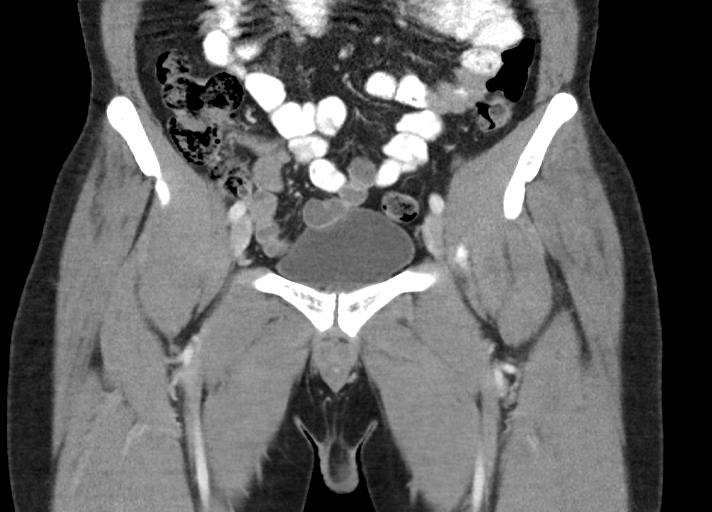
[im 36/81  soft-tissue]
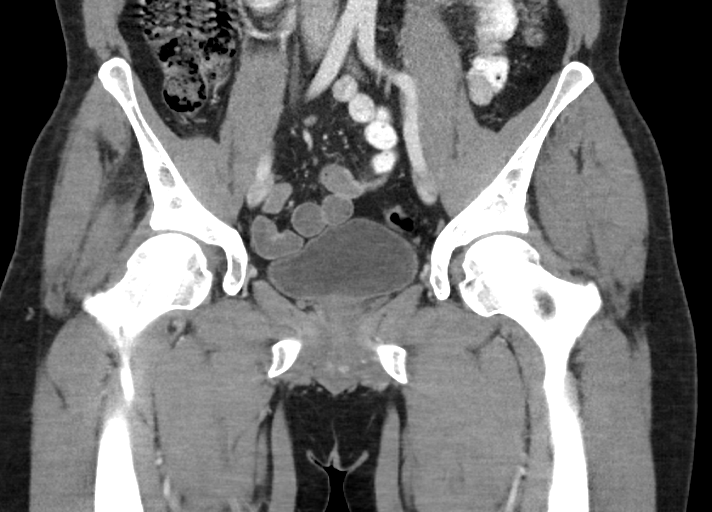
[im 45/81  soft-tissue]
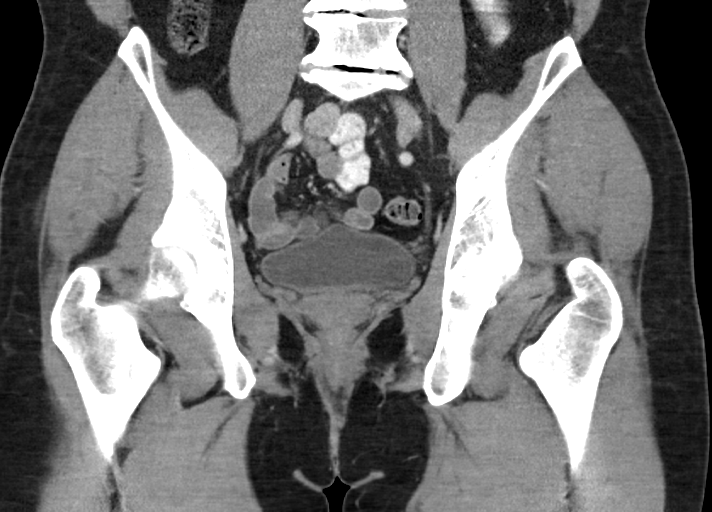

[16 of 46 positions shown; findings below may reference images not displayed]

FINDINGS: Urinary Tract:  Distal ureters and bladder normal.

Bowel: Limited view of the small bowel is unremarkable. Rectum
normal

Vascular/Lymphatic: Iliac artery is normal caliber. No pelvic
lymphadenopathy.

Reproductive: Prostate gland normal. Testicles are normal in size
and imaging characteristics by CT. No fluid within the scrotal sac.

Other:  No inguinal hernia.

Musculoskeletal: No aggressive osseous lesion
IMPRESSION: 1. Normal testicles in the inguinal canals by CT imaging.
2. No significant findings in the pelvis.

## 2017-09-14 IMAGING — DX DG CHEST 2V
2 series · 2 of 2 positions shown · non-contrast
Comparison: Chest radiograph performed 08/18/2012

CLINICAL DATA: Acute onset of neck pain, generalized weakness,
shortness of breath and chest discomfort extending to the left arm.
Initial encounter.

EXAM:
CHEST  2 VIEW

[chest pa]
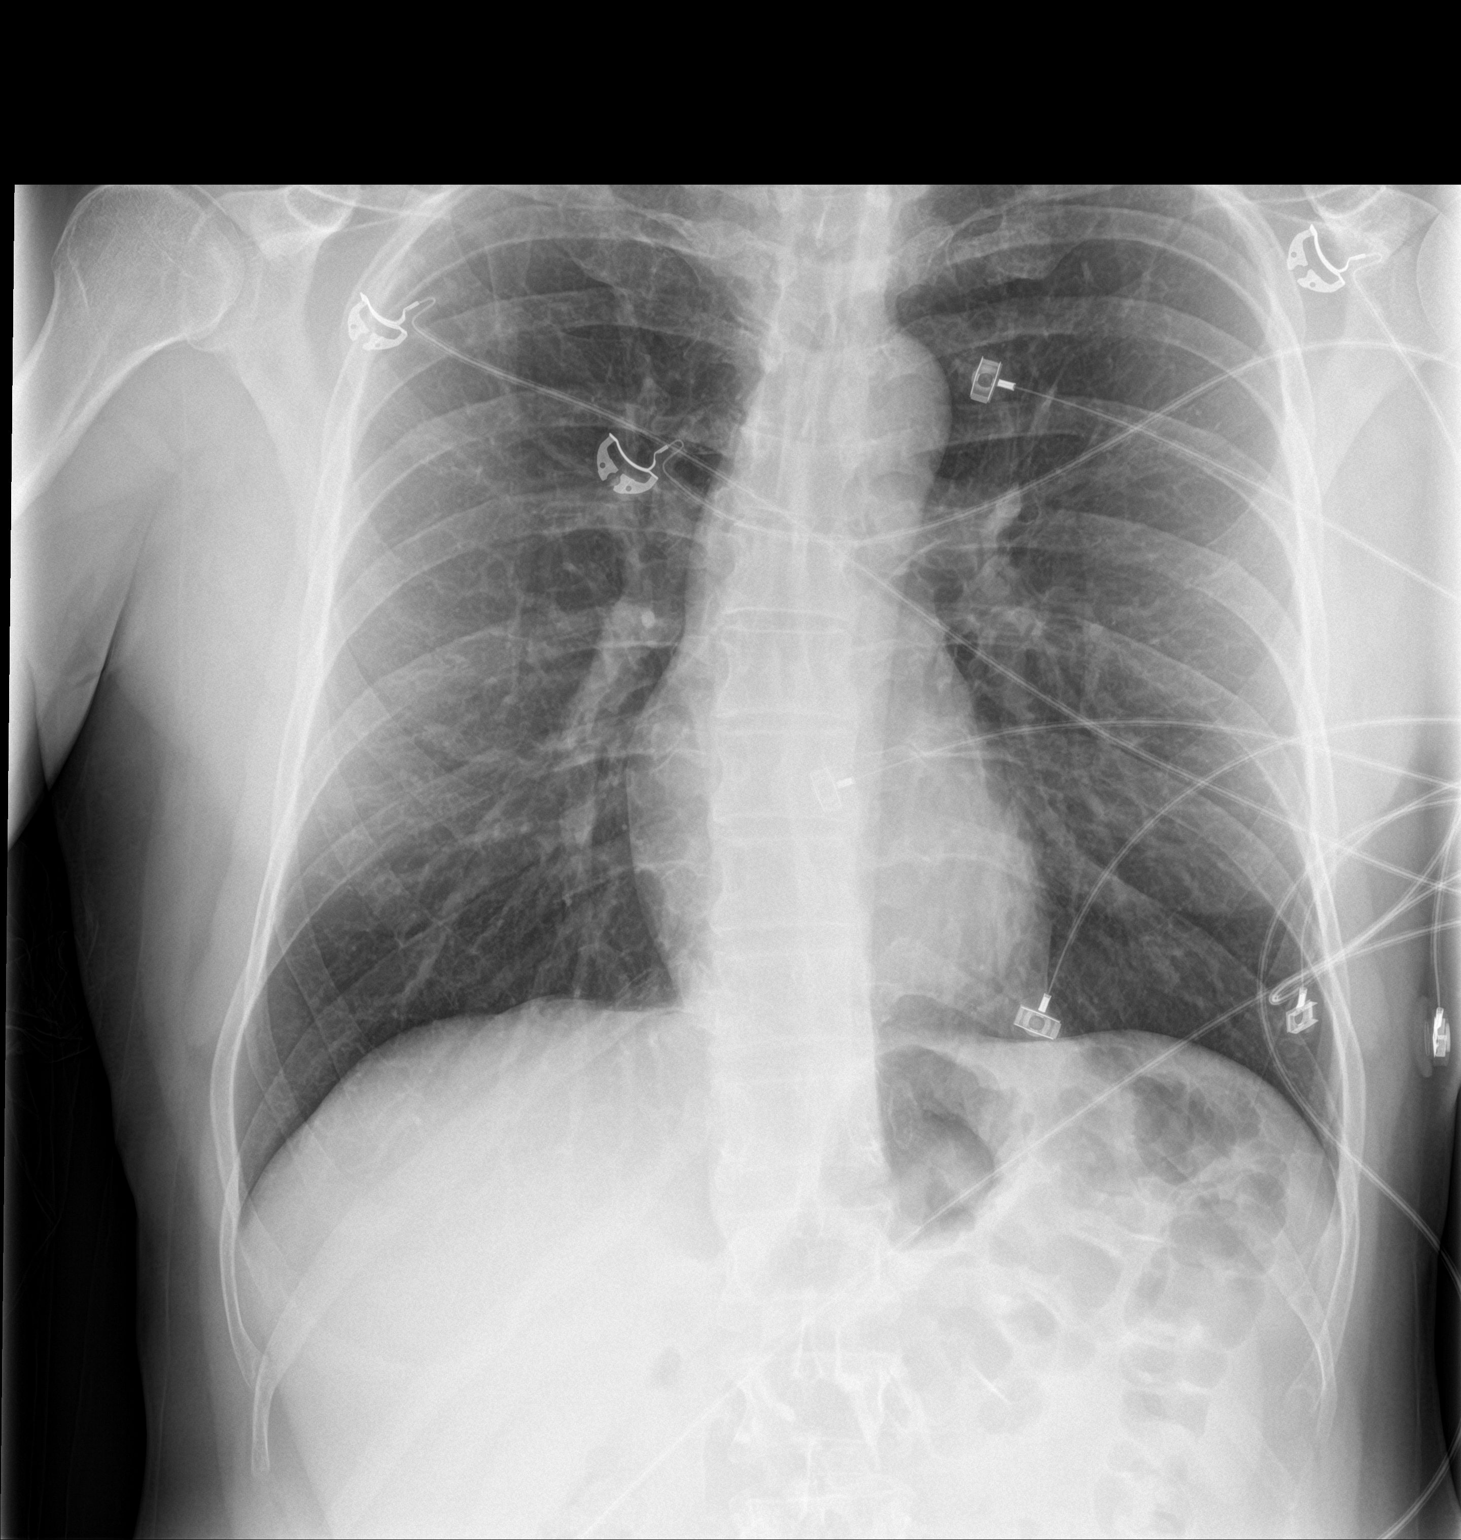

[chest lat]
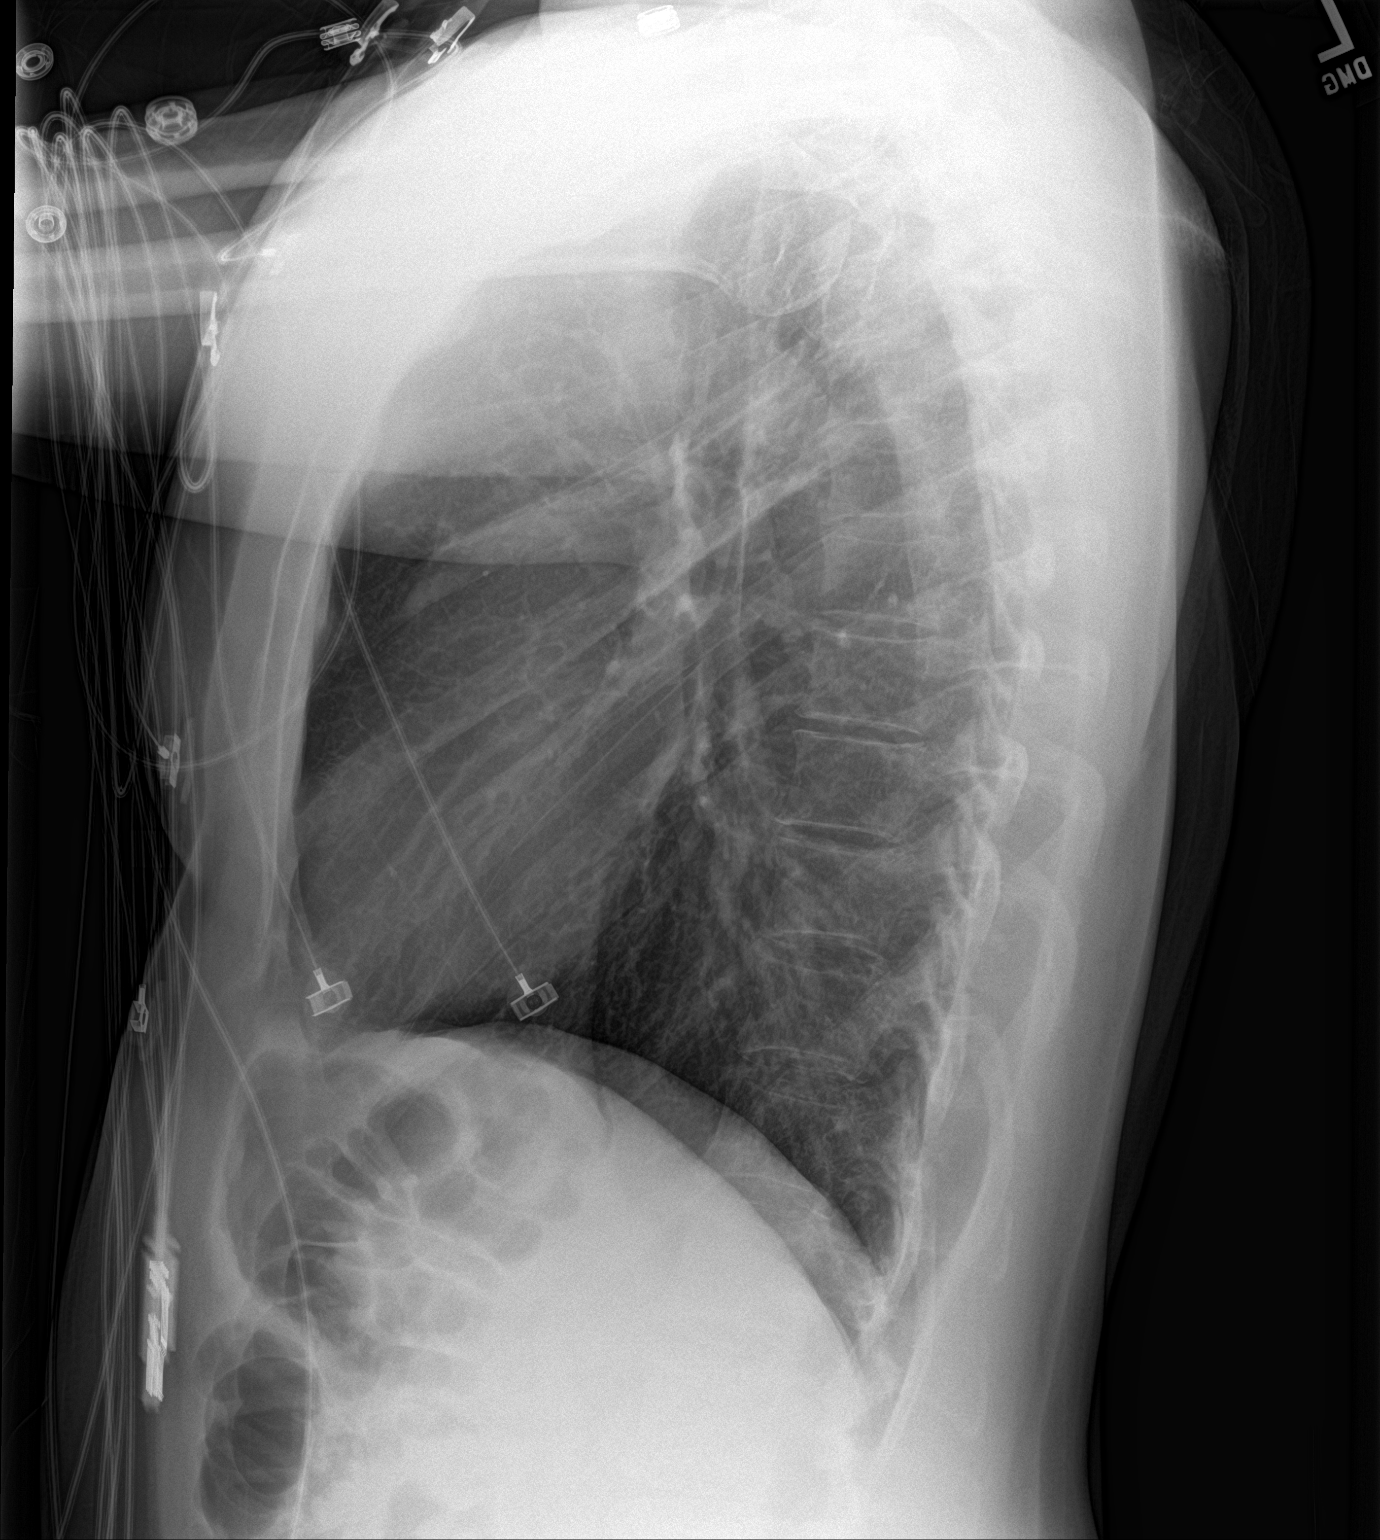

[2 of 2 positions shown; findings below may reference images not displayed]

FINDINGS: The lungs are well-aerated and clear. There is no evidence of focal
opacification, pleural effusion or pneumothorax.

The heart is normal in size; the mediastinal contour is within
normal limits. No acute osseous abnormalities are seen.
IMPRESSION: No acute cardiopulmonary process seen.

## 2018-04-11 ENCOUNTER — Encounter (INDEPENDENT_AMBULATORY_CARE_PROVIDER_SITE_OTHER): Payer: Self-pay | Admitting: *Deleted

## 2021-06-28 ENCOUNTER — Telehealth: Payer: Self-pay | Admitting: "Endocrinology

## 2021-06-28 NOTE — Telephone Encounter (Signed)
Received referral on pt for his thyroid from Dr Alonza Smoker office. I attempted several times to get the more information, as his ultrasound was from 2018. I have now closed this referral.

## 2021-07-17 ENCOUNTER — Encounter (HOSPITAL_BASED_OUTPATIENT_CLINIC_OR_DEPARTMENT_OTHER): Payer: Self-pay

## 2021-07-17 DIAGNOSIS — R0683 Snoring: Secondary | ICD-10-CM

## 2021-07-17 DIAGNOSIS — G4733 Obstructive sleep apnea (adult) (pediatric): Secondary | ICD-10-CM

## 2021-07-20 ENCOUNTER — Other Ambulatory Visit: Payer: Self-pay

## 2021-07-20 ENCOUNTER — Ambulatory Visit (HOSPITAL_BASED_OUTPATIENT_CLINIC_OR_DEPARTMENT_OTHER): Payer: PRIVATE HEALTH INSURANCE | Attending: Internal Medicine | Admitting: Internal Medicine

## 2021-07-20 VITALS — Ht 71.0 in | Wt 195.0 lb

## 2021-07-20 DIAGNOSIS — G4733 Obstructive sleep apnea (adult) (pediatric): Secondary | ICD-10-CM | POA: Diagnosis present

## 2021-07-20 DIAGNOSIS — R0683 Snoring: Secondary | ICD-10-CM

## 2021-07-29 DIAGNOSIS — R0683 Snoring: Secondary | ICD-10-CM | POA: Diagnosis not present

## 2021-07-29 NOTE — Procedures (Signed)
° ° °  Patient Name: Gordon Haley, Gordon Haley Date: 07/20/2021 Gender: Male D.O.B: 1970/07/20 Age (years): 50 Referring Provider: Carylon Perches Height (inches): 71 Interpreting Physician: Jetty Duhamel MD, ABSM Weight (lbs): 195 RPSGT: Cherylann Parr BMI: 27 MRN: [id number] Neck Size: 17.00  CLINICAL INFORMATION Sleep Study Type: NPSG Indication for sleep study: Fatigue, Snoring, Witnesses Apnea / Gasping During Sleep Epworth Sleepiness Score: 9  SLEEP STUDY TECHNIQUE As per the AASM Manual for the Scoring of Sleep and Associated Events v2.3 (April 2016) with a hypopnea requiring 4% desaturations.  The channels recorded and monitored were frontal, central and occipital EEG, electrooculogram (EOG), submentalis EMG (chin), nasal and oral airflow, thoracic and abdominal wall motion, anterior tibialis EMG, snore microphone, electrocardiogram, and pulse oximetry.  MEDICATIONS Medications self-administered by patient taken the night of the study : none reported  SLEEP ARCHITECTURE The study was initiated at 10:29:07 PM and ended at 4:38:12 AM.  Sleep onset time was 9.3 minutes and the sleep efficiency was 88.3%%. The total sleep time was 326 minutes.  Stage REM latency was 302.0 minutes.  The patient spent 3.2%% of the night in stage N1 sleep, 82.1%% in stage N2 sleep, 7.2%% in stage N3 and 7.5% in REM.  Alpha intrusion was absent.  Supine sleep was 61.35%.  RESPIRATORY PARAMETERS The overall apnea/hypopnea index (AHI) was 13.6 per hour. There were 43 total apneas, including 43 obstructive, 0 central and 0 mixed apneas. There were 31 hypopneas and 4 RERAs.  The AHI during Stage REM sleep was 19.6 per hour.  AHI while supine was 21.6 per hour.  The mean oxygen saturation was 94.7%. The minimum SpO2 during sleep was 87.0%.  moderate snoring was noted during this study.  CARDIAC DATA The 2 lead EKG demonstrated sinus rhythm. The mean heart rate was 57.6 beats per minute. Other EKG  findings include: None.  LEG MOVEMENT DATA The total PLMS were 0 with a resulting PLMS index of 0.0. Associated arousal with leg movement index was 0.2 .  IMPRESSIONS - Mild obstructive sleep apnea occurred during this study (AHI = 13.6/h). - Mild oxygen desaturation was noted during this study (Min O2 = 87.0%). Mean O2 saturation 94.8% - The patient snored with moderate snoring volume. - No cardiac abnormalities were noted during this study. - Clinically significant periodic limb movements did not occur during sleep. No significant associated arousals.  DIAGNOSIS - Obstructive Sleep Apnea (G47.33)  RECOMMENDATIONS - Suggest CPAP titration sleep study or autopap. Other options wpuild be based on clinical judgment. - Avoid alcohol, sedatives and other CNS depressants that may worsen sleep apnea and disrupt normal sleep architecture. - Sleep hygiene should be reviewed to assess factors that may improve sleep quality. - Weight management and regular exercise should be initiated or continued if appropriate.  [Electronically signed] 07/29/2021 12:01 PM  Jetty Duhamel MD, ABSM Diplomate, American Board of Sleep Medicine   NPI: [id number]                         Jetty Duhamel Diplomate, American Board of Sleep Medicine  ELECTRONICALLY SIGNED ON:  07/29/2021, 11:58 AM [facility] PH: [phone]   FX: [phone] ACCREDITED BY THE AMERICAN ACADEMY OF SLEEP MEDICINE

## 2021-08-06 NOTE — Progress Notes (Signed)
08/08/21- 50 yoM never smoker for sleep evaluation with concern of OSA, courtesy of Dr Asencion Noble Medical problem list includes Hypothyroid, Chronic Constipation, HTN, Nontoxic Thyroid Nodule,  NPSG 07/20/21- AHI 13.6/ hr, desaturation to a nadir of 87%, body weight 195 lbs Epworth score-N/A Body weight today-203 lbs Covid vax- Flu vax- Wife is reported loud snoring> separate bedrooms.Marland Kitchen  He recognizes daytime sleepiness if sitting quietly but denies any difficulty staying alert to drive for at work. Wakes sometimes with feeling of "congestion" in his throat.  This clears as he is upright and mobile and does not seem to involve either productive coughing or recognized reflux.  No dysphagia. No history of ENT surgery, heart or lung disease. Works as a Associate Professor  Prior to Admission medications   Medication Sig Start Date End Date Taking? Authorizing Provider  aspirin EC 81 MG tablet Take 1 tablet (81 mg total) by mouth daily. 10/18/16  Yes Branch, Alphonse Guild, MD  dicyclomine (BENTYL) 10 MG capsule TK 1 C PO TID 10/12/16  Yes [provider]  escitalopram (LEXAPRO) 5 MG tablet TK 1 T PO QD 10/11/16  Yes [provider]  gabapentin (NEURONTIN) 100 MG capsule Take 100 mg by mouth 3 (three) times daily.  10/02/16  Yes [provider]  levothyroxine (SYNTHROID, LEVOTHROID) 50 MCG tablet  12/17/10  Yes [provider]   Past Medical History:  Diagnosis Date   Arthritis    Goiter    Hypothyroidism    Past Surgical History:  Procedure Laterality Date   BACK SURGERY     2011   PILONIDAL CYST EXCISION     Family History  Problem Relation Age of Onset   Cancer Father        bladder   Lung cancer Mother    Diabetes Brother    Social History   Socioeconomic History   Marital status: Married    Spouse name: Not on file   Number of children: 2   Years of education: Not on file   Highest education level: Not on file  Occupational History   Occupation:  Occupational hygienist: [employer]  Tobacco Use   Smoking status: Never   Smokeless tobacco: Never  Vaping Use   Vaping Use: Never used  Substance and Sexual Activity   Alcohol use: Yes    Comment: occ couple beer/mo   Drug use: No   Sexual activity: Not on file  Other Topics Concern   Not on file  Social History Narrative   2 children 7/10-healthy   Social Determinants of Health   Financial Resource Strain: Not on file  Food Insecurity: Not on file  Transportation Needs: Not on file  Physical Activity: Not on file  Stress: Not on file  Social Connections: Not on file  Intimate Partner Violence: Not on file   ROS-see HPI   + = positive Constitutional:    weight loss, night sweats, fevers, chills, fatigue, lassitude. HEENT:    headaches, difficulty swallowing, tooth/dental problems, sore throat,       sneezing, itching, ear ache, nasal congestion, post nasal drip, snoring CV:    chest pain, orthopnea, PND, swelling in lower extremities, anasarca,      dizziness, palpitations Resp:   shortness of breath with exertion or at rest.                productive cough,   non-productive cough, coughing up of  blood.              change in color of mucus.  wheezing.   Skin:    rash or lesions. GI:  No-   heartburn, indigestion, abdominal pain, nausea, vomiting, diarrhea,                 change in bowel habits, loss of appetite GU: dysuria, change in color of urine, no urgency or frequency.   flank pain. MS:   joint pain, stiffness, decreased range of motion, back pain. Neuro-     nothing unusual Psych:  change in mood or affect.  depression or anxiety.   memory loss. OBJ- Physical Exam General- Alert, Oriented, Affect-appropriate, Distress- none acute Skin- rash-none, lesions- none, excoriation- none Lymphadenopathy- none Head- atraumatic            Eyes- Gross vision intact, PERRLA, conjunctivae and secretions clear             Ears- Hearing, canals-normal            Nose- Clear, no-Septal dev, mucus, polyps, erosion, perforation             Throat- Mallampati III , mucosa clear , drainage- none, tonsils- atrophic, +teeth Neck- flexible , trachea midline, no stridor , thyroid nl, carotid no bruit Chest - symmetrical excursion , unlabored           Heart/CV- RRR , no murmur , no gallop  , no rub, nl s1 s2                           - JVD- none , edema- none, stasis changes- none, varices- none           Lung- clear to P&A, wheeze- none, cough- none , dullness-none, rub- none           Chest wall-  Abd-  Br/ Gen/ Rectal- Not done, not indicated Extrem- cyanosis- none, clubbing, none, atrophy- none, strength- nl Neuro- grossly intact to observation

## 2021-08-08 ENCOUNTER — Other Ambulatory Visit: Payer: Self-pay

## 2021-08-08 ENCOUNTER — Ambulatory Visit (INDEPENDENT_AMBULATORY_CARE_PROVIDER_SITE_OTHER): Payer: PRIVATE HEALTH INSURANCE | Admitting: Internal Medicine

## 2021-08-08 ENCOUNTER — Encounter: Payer: Self-pay | Admitting: Internal Medicine

## 2021-08-08 VITALS — BP 130/84 | HR 78 | Temp 98.3°F | Ht 71.0 in | Wt 203.4 lb

## 2021-08-08 DIAGNOSIS — Z23 Encounter for immunization: Secondary | ICD-10-CM

## 2021-08-08 DIAGNOSIS — J189 Pneumonia, unspecified organism: Secondary | ICD-10-CM

## 2021-08-08 DIAGNOSIS — R0683 Snoring: Secondary | ICD-10-CM

## 2021-08-08 DIAGNOSIS — G4733 Obstructive sleep apnea (adult) (pediatric): Secondary | ICD-10-CM | POA: Diagnosis not present

## 2021-08-08 DIAGNOSIS — I10 Essential (primary) hypertension: Secondary | ICD-10-CM

## 2021-08-08 NOTE — Patient Instructions (Signed)
Order- new DME, new CPAP auto 5-20, mask of choice, humidifier, supplies, AirView, card  Please call if we can help

## 2021-08-10 ENCOUNTER — Telehealth: Payer: Self-pay | Admitting: Internal Medicine

## 2021-08-11 NOTE — Telephone Encounter (Signed)
Printed office note and faxed over to [facility]. [place] further needed

## 2021-08-16 ENCOUNTER — Encounter: Payer: Self-pay | Admitting: Internal Medicine

## 2021-08-16 DIAGNOSIS — G4733 Obstructive sleep apnea (adult) (pediatric): Secondary | ICD-10-CM | POA: Insufficient documentation

## 2021-08-16 DIAGNOSIS — I10 Essential (primary) hypertension: Secondary | ICD-10-CM | POA: Insufficient documentation

## 2021-08-16 NOTE — Assessment & Plan Note (Signed)
Managed by Dr. Willey Blade.  We discussed association between OSA and HTN.

## 2021-08-16 NOTE — Assessment & Plan Note (Addendum)
We discussed medical concerns associated with OSA, basic sleep hygiene, treatment options, safe driving responsibility. Plan-starting CPAP

## 2021-08-23 ENCOUNTER — Telehealth: Payer: Self-pay | Admitting: Internal Medicine

## 2021-08-23 NOTE — Telephone Encounter (Signed)
Attempted to call pt but line went directly to VM. Unable to leave VM as mailbox is full. Will try to call back later. 

## 2021-08-24 NOTE — Telephone Encounter (Signed)
Spoke with pt who stated [facility] has confirmed appointment for mask fitting. Pt stated no needs at this time.  ?

## 2021-12-06 NOTE — Progress Notes (Deleted)
08/08/21- 50 yoM never smoker for sleep evaluation with concern of OSA, courtesy of Dr Carylon Perches Medical problem list includes Hypothyroid, Chronic Constipation, HTN, Nontoxic Thyroid Nodule,  NPSG 07/20/21- AHI 13.6/ hr, desaturation to a nadir of 87%, body weight 195 lbs Epworth score-N/A Body weight today-203 lbs Covid vax- Flu vax- Wife is reported loud snoring> separate bedrooms.Marland Kitchen  He recognizes daytime sleepiness if sitting quietly but denies any difficulty staying alert to drive for at work. Wakes sometimes with feeling of "congestion" in his throat.  This clears as he is upright and mobile and does not seem to involve either productive coughing or recognized reflux.  No dysphagia. No history of ENT surgery, heart or lung disease. Works as a Technical sales engineer  12/07/21- 39 yoM never smoker followed for OSA, complicated by Hypothyroid, Chronic Constipation, HTN, Nontoxic Thyroid Nodule,  CPAP AUTO 5-20/ [facility]   ordered 08/08/21 Download- Body weight today- Covid vax-   ROS-see HPI   + = positive Constitutional:    weight loss, night sweats, fevers, chills, fatigue, lassitude. HEENT:    headaches, difficulty swallowing, tooth/dental problems, sore throat,       sneezing, itching, ear ache, nasal congestion, post nasal drip, snoring CV:    chest pain, orthopnea, PND, swelling in lower extremities, anasarca,      dizziness, palpitations Resp:   shortness of breath with exertion or at rest.                productive cough,   non-productive cough, coughing up of blood.              change in color of mucus.  wheezing.   Skin:    rash or lesions. GI:  No-   heartburn, indigestion, abdominal pain, nausea, vomiting, diarrhea,                 change in bowel habits, loss of appetite GU: dysuria, change in color of urine, no urgency or frequency.   flank pain. MS:   joint pain, stiffness, decreased range of motion, back pain. Neuro-     nothing unusual Psych:  change in mood or affect.   depression or anxiety.   memory loss. OBJ- Physical Exam General- Alert, Oriented, Affect-appropriate, Distress- none acute Skin- rash-none, lesions- none, excoriation- none Lymphadenopathy- none Head- atraumatic            Eyes- Gross vision intact, PERRLA, conjunctivae and secretions clear            Ears- Hearing, canals-normal            Nose- Clear, no-Septal dev, mucus, polyps, erosion, perforation             Throat- Mallampati III , mucosa clear , drainage- none, tonsils- atrophic, +teeth Neck- flexible , trachea midline, no stridor , thyroid nl, carotid no bruit Chest - symmetrical excursion , unlabored           Heart/CV- RRR , no murmur , no gallop  , no rub, nl s1 s2                           - JVD- none , edema- none, stasis changes- none, varices- none           Lung- clear to P&A, wheeze- none, cough- none , dullness-none, rub- none           Chest wall-  Abd-  Br/ Gen/ Rectal- Not done, not indicated  Extrem- cyanosis- none, clubbing, none, atrophy- none, strength- nl Neuro- grossly intact to observation

## 2021-12-07 ENCOUNTER — Ambulatory Visit: Payer: PRIVATE HEALTH INSURANCE | Admitting: Internal Medicine

## 2021-12-19 LAB — TSH: TSH: 4.37 (ref 0.41–5.90)

## 2022-05-29 LAB — TSH: TSH: 3.57 (ref 0.41–5.90)

## 2022-06-05 ENCOUNTER — Other Ambulatory Visit (HOSPITAL_COMMUNITY): Payer: Self-pay | Admitting: Internal Medicine

## 2022-06-05 DIAGNOSIS — R059 Cough, unspecified: Secondary | ICD-10-CM

## 2022-06-11 NOTE — Patient Instructions (Signed)

## 2022-06-12 ENCOUNTER — Encounter: Payer: PRIVATE HEALTH INSURANCE | Admitting: Nurse Practitioner

## 2022-06-12 DIAGNOSIS — E042 Nontoxic multinodular goiter: Secondary | ICD-10-CM

## 2022-06-12 DIAGNOSIS — E038 Other specified hypothyroidism: Secondary | ICD-10-CM

## 2022-06-13 NOTE — Progress Notes (Signed)
Erroneous encounter

## 2022-07-23 ENCOUNTER — Ambulatory Visit (HOSPITAL_COMMUNITY)
Admission: RE | Admit: 2022-07-23 | Discharge: 2022-07-23 | Disposition: A | Discharge status: Home / Self Care | Payer: PRIVATE HEALTH INSURANCE | Source: Ambulatory Visit | Attending: Internal Medicine | Admitting: Internal Medicine

## 2022-07-23 DIAGNOSIS — R059 Cough, unspecified: Secondary | ICD-10-CM

## 2022-07-24 ENCOUNTER — Encounter: Payer: Self-pay | Admitting: Nurse Practitioner

## 2022-07-31 NOTE — Patient Instructions (Signed)

## 2022-08-02 ENCOUNTER — Encounter: Payer: Self-pay | Admitting: Nurse Practitioner

## 2022-08-02 ENCOUNTER — Ambulatory Visit (INDEPENDENT_AMBULATORY_CARE_PROVIDER_SITE_OTHER): Payer: PRIVATE HEALTH INSURANCE | Admitting: Nurse Practitioner

## 2022-08-02 VITALS — BP 119/80 | HR 83 | Ht 71.0 in | Wt 209.8 lb

## 2022-08-02 DIAGNOSIS — E038 Other specified hypothyroidism: Secondary | ICD-10-CM

## 2022-08-02 DIAGNOSIS — E041 Nontoxic single thyroid nodule: Secondary | ICD-10-CM

## 2022-08-02 DIAGNOSIS — E063 Autoimmune thyroiditis: Secondary | ICD-10-CM

## 2022-08-02 DIAGNOSIS — E039 Hypothyroidism, unspecified: Secondary | ICD-10-CM

## 2022-08-02 NOTE — Progress Notes (Signed)
Endocrinology Consult Note                                         08/02/2022, 2:15 PM  Subjective:   Subjective    Gordon Haley is a 52 y.o.-year-old male patient being seen in consultation for hypothyroidism and MNG referred by Asencion Noble, MD.   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[place]: [employer]  Tobacco Use   Smoking status: Never   Smokeless tobacco: Never  Vaping Use   Vaping Use: Never used  Substance and Sexual Activity   Alcohol use: Yes    Comment: occ couple beer/mo   Drug use: No   Sexual activity: Not on file  Other Topics Concern   Not on file  Social History Narrative   2 children 7/10-healthy   Social Determinants of Health   Financial Resource Strain: Not on file  Food Insecurity: Not on file  Transportation Needs: Not on file  Physical Activity: Not on file  Stress: Not on file  Social Connections: Not on file    Family History  Problem Relation Age of Onset   Cancer Father        bladder   Lung cancer Mother    Diabetes Brother     Outpatient Encounter Medications as of 08/02/2022  Medication Sig   escitalopram (LEXAPRO) 5 MG tablet TK 1 T PO QD   levothyroxine (SYNTHROID, LEVOTHROID) 50 MCG tablet    olmesartan (BENICAR) 20 MG tablet Take 20 mg by mouth daily.   aspirin EC 81 MG tablet Take 1 tablet (81 mg total) by mouth daily. (Patient not taking: Reported on 08/02/2022)   dicyclomine (BENTYL) 10 MG capsule TK 1 C PO  TID (Patient not taking: Reported on 08/02/2022)   gabapentin (NEURONTIN) 100 MG capsule Take 100 mg by mouth 3 (three) times daily.  (Patient not taking: Reported on 08/02/2022)   No facility-administered encounter medications on file as of 08/02/2022.    ALLERGIES: No Known Allergies VACCINATION STATUS: Immunization History  Administered Date(s) Administered   Influenza,inj,Quad PF,6+ Mos 06/05/2021   Influenza-Unspecified 04/11/2017   Pneumococcal Polysaccharide-23 04/30/2000     HPI   Gordon Haley is a patient with the above medical history. He is a former patient of Dr. Elyse Hsu.  He was he was diagnosed with hypothyroidism at approximate age of 35 years, which required subsequent initiation of thyroid hormone replacement therapy. he was  given various doses of branded Synthroid over the years, currently on 50 micrograms. he reports compliance to this medication:  Taking it daily on empty stomach with water, separated by >30 minutes before breakfast and other medications, and by at least 4 hours from calcium, iron, PPIs, multivitamins.  He was also diagnosed with MNG.  His last thyroid ultrasound was in 2018 showing stable right mid thyroid nodule measuring 5 cm.  This nodule was previously biopsied and was negative for malignancy.  I reviewed patient's thyroid tests:  Lab Results  Component Value Date   TSH 3.57 05/29/2022   TSH 4.37 12/19/2021     Pt describes: - weight gain - fatigue  Pt denies hoarseness, dysphagia/odynophagia, SOB with lying down.  He does feel nodule in right side of neck (noticed this prior to his initial diagnosis when he was shaving).  He was diagnosed with OSA on bipap.  he does have extensive family history of thyroid disorders.  No family history of thyroid cancer.  No history of radiation therapy to head or neck.  No recent use of iodine supplements.  Denies use of Biotin containing supplements.  I reviewed his chart and he also has a history of  OSA.   ROS:  Constitutional: + weight gain, + fatigue, no subjective hyperthermia, no subjective hypothermia Eyes: no blurry vision, no xerophthalmia ENT: no sore throat, no nodules palpated in throat, no dysphagia/odynophagia, no hoarseness Cardiovascular: no chest pain, no SOB, no palpitations, no leg swelling Respiratory: no cough, no SOB Gastrointestinal: no nausea/vomiting/diarrhea Musculoskeletal: no muscle/joint aches Skin: no rashes Neurological: no tremors, no numbness, no tingling, no dizziness Psychiatric: no depression, no anxiety   Objective:   Objective     BP 119/80 (BP Location: Left Arm, Patient Position: Sitting, Cuff Size: Large)   Pulse 83   Ht 5\' 11"  (1.803 m)   Wt 209 lb 12.8 oz (95.2 kg)   BMI 29.26 kg/m  Wt Readings from Last 3 Encounters:  08/02/22 209 lb 12.8 oz (95.2 kg)  08/08/21 203 lb 6.4 oz (92.3 kg)  07/20/21 195 lb (88.5 kg)    BP Readings from Last 3 Encounters:  08/02/22 119/80  08/08/21 130/84  10/18/16 (!) 144/92     Constitutional:  Body mass index is 29.26 kg/m., not in acute distress, normal state of mind Eyes: PERRLA, EOMI, no exophthalmos ENT: moist mucous membranes, + thyromegaly with palpable nodularity on right side, no cervical lymphadenopathy Cardiovascular: normal precordial activity, RRR, no murmur/rubs/gallops Respiratory:  adequate breathing efforts, no gross chest deformity, Clear to auscultation bilaterally Gastrointestinal: abdomen soft, non-tender, no distension, bowel sounds present Musculoskeletal: no gross deformities, strength intact in all four extremities Skin: moist, warm, no rashes Neurological: slight tremor to right hand, none to left, deep tendon reflexes normal in BLE.   CMP ( most recent) CMP     Component Value Date/Time   NA 139 09/30/2016 2031   K 3.5 09/30/2016 2031   CL 102 09/30/2016 2031   CO2 28 09/30/2016 2031   GLUCOSE 109 (H) 09/30/2016 2031   BUN 14 09/30/2016 2031    CREATININE 0.80 09/30/2016 2031   CALCIUM 9.2 09/30/2016 2031   PROT 7.4 09/30/2016 2031   ALBUMIN 4.2 09/30/2016 2031   AST 24 09/30/2016 2031   ALT 24 09/30/2016 2031   ALKPHOS 51 09/30/2016 2031   BILITOT 0.9 09/30/2016 2031   GFRNONAA >60 09/30/2016 2031   GFRAA >60 09/30/2016 2031     Diabetic Labs (most recent): No  results found for: "HGBA1C", "MICROALBUR"   Lipid Panel ( most recent) Lipid Panel  No results found for: "CHOL", "TRIG", "HDL", "CHOLHDL", "VLDL", "LDLCALC", "LDLDIRECT", "LABVLDL"     Lab Results  Component Value Date   TSH 3.57 05/29/2022   TSH 4.37 12/19/2021      Assessment & Plan:   ASSESSMENT / PLAN:  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 MNG   Patient with long-standing hypothyroidism, on levothyroxine therapy. On physical exam , patient does not  have neck compression symptoms.  His last thyroid ultrasound was in 2018.  Will order new one for surveillance purposes.  He is advised to continue his current dose of branded-Synthroid 50 mcg po daily before breakfast.  We did discuss the possibility of changing this to generic form to conserve cost.  Will recheck TFTs today before making any changes and will call patient with instructions.  - We discussed about correct intake of levothyroxine, at fasting, with water, separated by at least 30 minutes from breakfast, and separated by more than 4 hours from calcium, iron, multivitamins, acid reflux medications (PPIs). -Patient is made aware of the fact that thyroid hormone replacement is needed for life, dose to be adjusted by periodic monitoring of thyroid function tests.    - Time spent with the patient: 45 minutes, of which >50% was spent in obtaining information about his symptoms, reviewing his previous labs, evaluations, and treatments, counseling him about his hypothyroidism, and developing a plan to confirm the diagnosis and long term treatment as necessary. Please refer to "Patient Self  Inventory" in the Media tab for reviewed elements of pertinent patient history.  Holli Humbles participated in the discussions, expressed understanding, and voiced agreement with the above plans.  All questions were answered to his satisfaction. he is encouraged to contact clinic should he have any questions or concerns prior to his return visit.   FOLLOW UP PLAN:  Return in about 1 month (around 08/31/2022) for Thyroid follow up, Previsit labs, thyroid ultrasound.  Rayetta Pigg, [facility] [address] [place],  [address] Phone: [phone] Fax: [phone]  08/02/2022, 2:15 PM

## 2022-08-03 ENCOUNTER — Other Ambulatory Visit: Payer: Self-pay | Admitting: Nurse Practitioner

## 2022-08-03 ENCOUNTER — Telehealth: Payer: Self-pay | Admitting: *Deleted

## 2022-08-03 LAB — TSH: TSH: 3.76 u[IU]/mL (ref 0.450–4.500)

## 2022-08-03 LAB — T4, FREE: Free T4: 1.1 ng/dL (ref 0.82–1.77)

## 2022-08-03 MED ORDER — LEVOTHYROXINE SODIUM 75 MCG PO TABS: 75.0000 ug | ORAL_TABLET | Freq: Every day | ORAL | 1 refills | Status: DC

## 2022-08-03 NOTE — Telephone Encounter (Signed)
-----   Message from Brita Romp, NP sent at 08/03/2022  7:38 AM EST ----- Please call patient and let him know that based on his thyroid lab results, he could tolerate a dosage adjustment of his thyroid hormone.  We also discussed changing to generic, so that is what I will be sending in.  I want to increase his Levothyroxine to 75 mcg po daily before breakfast.  This should help get both of his labs to goal.  We will do labs again in about 2 months or so to check on his progress but I will see him prior to that as we are following up in a month after he has his ultrasound completed.

## 2022-08-03 NOTE — Telephone Encounter (Signed)
Patient was called and a message was left with the results per Rayetta Pigg, NP

## 2022-08-06 NOTE — Telephone Encounter (Signed)
Pt notified of result and agrees

## 2022-08-14 ENCOUNTER — Ambulatory Visit (HOSPITAL_COMMUNITY): Payer: PRIVATE HEALTH INSURANCE

## 2022-08-20 ENCOUNTER — Ambulatory Visit (HOSPITAL_COMMUNITY)
Admission: RE | Admit: 2022-08-20 | Discharge: 2022-08-20 | Disposition: A | Discharge status: Home / Self Care | Payer: PRIVATE HEALTH INSURANCE | Source: Ambulatory Visit | Attending: Nurse Practitioner | Admitting: Nurse Practitioner

## 2022-08-20 DIAGNOSIS — E041 Nontoxic single thyroid nodule: Secondary | ICD-10-CM | POA: Diagnosis present

## 2022-08-20 DIAGNOSIS — E038 Other specified hypothyroidism: Secondary | ICD-10-CM | POA: Diagnosis present

## 2022-08-20 DIAGNOSIS — E063 Autoimmune thyroiditis: Secondary | ICD-10-CM | POA: Insufficient documentation

## 2022-08-23 ENCOUNTER — Telehealth: Payer: Self-pay | Admitting: Nurse Practitioner

## 2022-08-23 NOTE — Telephone Encounter (Signed)
Pt is asking if you need to give him his ultrasound results now or can they wait until appt

## 2022-08-24 NOTE — Telephone Encounter (Signed)
We will discuss in detail on his follow up appt but pretty much it was the same as his previous ultrasound in 2018.

## 2022-09-03 NOTE — Patient Instructions (Signed)

## 2022-09-04 ENCOUNTER — Ambulatory Visit (INDEPENDENT_AMBULATORY_CARE_PROVIDER_SITE_OTHER): Payer: PRIVATE HEALTH INSURANCE | Admitting: Nurse Practitioner

## 2022-09-04 ENCOUNTER — Encounter: Payer: Self-pay | Admitting: Nurse Practitioner

## 2022-09-04 VITALS — BP 131/85 | HR 78 | Ht 71.0 in | Wt 210.4 lb

## 2022-09-04 DIAGNOSIS — E038 Other specified hypothyroidism: Secondary | ICD-10-CM

## 2022-09-04 DIAGNOSIS — E063 Autoimmune thyroiditis: Secondary | ICD-10-CM

## 2022-09-04 DIAGNOSIS — E041 Nontoxic single thyroid nodule: Secondary | ICD-10-CM | POA: Diagnosis not present

## 2022-09-04 MED ORDER — SYNTHROID 88 MCG PO TABS: 88.0000 ug | ORAL_TABLET | Freq: Every day | ORAL | 0 refills | Status: DC

## 2022-09-04 MED ORDER — LEVOTHYROXINE SODIUM 88 MCG PO TABS: 88.0000 ug | ORAL_TABLET | Freq: Every day | ORAL | 1 refills | Status: DC

## 2022-09-04 MED ORDER — LEVOTHYROXINE SODIUM 88 MCG PO TABS: 88.0000 ug | ORAL_TABLET | Freq: Every day | ORAL | 0 refills | Status: DC

## 2022-09-04 NOTE — Progress Notes (Signed)
Endocrinology Consult Note                                         09/04/2022, 3:32 PM  Subjective:   Subjective    Gordon Haley is a 52 y.o.-year-old male patient being seen in consultation for hypothyroidism and MNG referred by Asencion Noble, MD.   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 [employer]  Tobacco Use   Smoking status: Never   Smokeless tobacco: Never  Vaping Use   Vaping Use: Never used  Substance and Sexual Activity   Alcohol use: Yes    Comment: occ couple beer/mo   Drug use: No   Sexual activity: Not on file  Other Topics Concern   Not on file  Social History Narrative   2 children 7/10-healthy   Social Determinants of Health   Financial Resource Strain: Not on file  Food Insecurity: Not on file  Transportation Needs: Not on file  Physical Activity: Not on file  Stress: Not on file  Social Connections: Not on file    Family History  Problem Relation Age of Onset   Cancer Father        bladder   Lung cancer Mother    Diabetes Brother     Outpatient Encounter Medications as of 09/04/2022  Medication Sig   escitalopram (LEXAPRO) 5 MG tablet TK 1 T PO QD   olmesartan (BENICAR) 20 MG tablet Take 20 mg by mouth daily.   [DISCONTINUED] levothyroxine (SYNTHROID) 75 MCG tablet Take 1 tablet (75 mcg total) by mouth daily.   [DISCONTINUED] SYNTHROID 88 MCG tablet Take 1 tablet (88 mcg total) by mouth daily before breakfast.    aspirin EC 81 MG tablet Take 1 tablet (81 mg total) by mouth daily. (Patient not taking: Reported on 08/02/2022)   dicyclomine (BENTYL) 10 MG capsule TK 1 C PO TID (Patient not taking: Reported on 08/02/2022)   gabapentin (NEURONTIN) 100 MG capsule Take 100 mg by mouth 3 (three) times daily.  (Patient not taking: Reported on 08/02/2022)   levothyroxine (SYNTHROID) 88 MCG tablet Take 1 tablet (88 mcg total) by mouth daily before breakfast.   [DISCONTINUED] levothyroxine (SYNTHROID) 88 MCG tablet Take 1 tablet (88 mcg total) by mouth daily before breakfast.   No facility-administered encounter medications on file as of 09/04/2022.    ALLERGIES: No Known Allergies VACCINATION STATUS: Immunization History  Administered Date(s) Administered  Influenza,inj,Quad PF,6+ Mos 06/05/2021   Influenza-Unspecified 04/11/2017   Pneumococcal Polysaccharide-23 04/30/2000     HPI   Gordon Haley is a patient with the above medical history. He is a former patient of Dr. Elyse Hsu.  He was he was diagnosed with hypothyroidism at approximate age of 8 years, which required subsequent initiation of thyroid hormone replacement therapy. he was given various doses of branded Synthroid over the years, currently on 50 micrograms. he reports compliance to this medication:  Taking it daily on empty stomach with water, separated by >30 minutes before breakfast and other medications, and by at least 4 hours from calcium, iron, PPIs, multivitamins.  He was also diagnosed with MNG.  His last thyroid ultrasound was in 2018 showing stable right mid thyroid nodule measuring 5 cm.  This nodule was previously biopsied and was negative for malignancy.  I reviewed patient's thyroid tests:  Lab Results  Component Value Date   TSH 3.760 08/02/2022   TSH 3.57 05/29/2022   TSH 4.37 12/19/2021   FREET4 1.10 08/02/2022     Pt describes: - weight gain - fatigue  Pt denies hoarseness, dysphagia/odynophagia, SOB with lying down.   He does feel nodule in right side of neck (noticed this prior to his initial diagnosis when he was shaving).  He was diagnosed with OSA on bipap.  he does have extensive family history of thyroid disorders.  No family history of thyroid cancer.  No history of radiation therapy to head or neck.  No recent use of iodine supplements.  Denies use of Biotin containing supplements.  I reviewed his chart and he also has a history of OSA.   ROS:  Constitutional: + weight gain, + fatigue, no subjective hyperthermia, no subjective hypothermia Eyes: no blurry vision, no xerophthalmia ENT: no sore throat, no nodules palpated in throat, no dysphagia/odynophagia, no hoarseness Cardiovascular: no chest pain, no SOB, no palpitations, no leg swelling Respiratory: no cough, no SOB Gastrointestinal: no nausea/vomiting/diarrhea Musculoskeletal: no muscle/joint aches Skin: no rashes Neurological: no tremors, no numbness, no tingling, no dizziness Psychiatric: no depression, no anxiety   Objective:   Objective     BP 131/85 (BP Location: Left Arm, Patient Position: Sitting, Cuff Size: Large)   Pulse 78   Ht '5\' 11"'$  (1.803 m)   Wt 210 lb 6.4 oz (95.4 kg)   BMI 29.34 kg/m  Wt Readings from Last 3 Encounters:  09/04/22 210 lb 6.4 oz (95.4 kg)  08/02/22 209 lb 12.8 oz (95.2 kg)  08/08/21 203 lb 6.4 oz (92.3 kg)    BP Readings from Last 3 Encounters:  09/04/22 131/85  08/02/22 119/80  08/08/21 130/84     Constitutional:  Body mass index is 29.34 kg/m., not in acute distress, normal state of mind Eyes: PERRLA, EOMI, no exophthalmos ENT: moist mucous membranes, + thyromegaly with palpable nodularity on right side, no cervical lymphadenopathy Cardiovascular: normal precordial activity, RRR, no murmur/rubs/gallops Respiratory:  adequate breathing efforts, no gross chest deformity, Clear to auscultation bilaterally Gastrointestinal: abdomen soft, non-tender, no distension, bowel sounds  present Musculoskeletal: no gross deformities, strength intact in all four extremities Skin: moist, warm, no rashes Neurological: slight tremor to right hand, none to left, deep tendon reflexes normal in BLE.   CMP ( most recent) CMP     Component Value Date/Time   NA 139 09/30/2016 2031   K 3.5 09/30/2016 2031   CL 102 09/30/2016 2031   CO2 28 09/30/2016 2031   GLUCOSE 109 (H) 09/30/2016 2031  BUN 14 09/30/2016 2031   CREATININE 0.80 09/30/2016 2031   CALCIUM 9.2 09/30/2016 2031   PROT 7.4 09/30/2016 2031   ALBUMIN 4.2 09/30/2016 2031   AST 24 09/30/2016 2031   ALT 24 09/30/2016 2031   ALKPHOS 51 09/30/2016 2031   BILITOT 0.9 09/30/2016 2031   GFRNONAA >60 09/30/2016 2031   GFRAA >60 09/30/2016 2031     Diabetic Labs (most recent): No results found for: "HGBA1C", "MICROALBUR"   Lipid Panel ( most recent) Lipid Panel  No results found for: "CHOL", "TRIG", "HDL", "CHOLHDL", "VLDL", "LDLCALC", "LDLDIRECT", "LABVLDL"     Lab Results  Component Value Date   TSH 3.760 08/02/2022   TSH 3.57 05/29/2022   TSH 4.37 12/19/2021   FREET4 1.10 08/02/2022     Thyroid US from 08/20/22 CLINICAL DATA:  52 year old male with multinodular thyroid   EXAM: THYROID ULTRASOUND   TECHNIQUE: Ultrasound examination of the thyroid gland and adjacent soft tissues was performed.   COMPARISON:  04/22/2017   Prior biopsy of right thyroid nodule 03/05/2013   FINDINGS: Parenchymal Echotexture: Markedly heterogenous   Isthmus: 0.7 cm   Right lobe: 8.0 cm x 2.7 cm x 3.3 cm   Left lobe: 4.7 cm x 1.6 cm x 1.7 cm   _________________________________________________________   Estimated total number of nodules >/= 1 cm: 1   Number of spongiform nodules >/=  2 cm not described below (TR1): 0   Number of mixed cystic and solid nodules >/= 1.5 cm not described below (TR2): 0   _________________________________________________________   Right inferior thyroid nodule labeled 1 is  relatively unchanged, 4.8 cm and previously 5.0 cm. This nodule has undergone prior biopsy 03/05/2013. Assuming benign result, no further specific follow-up would be indicated.   The nodule labeled 2 in the mid left thyroid measures 6 mm and does not meet criteria for surveillance.   No adenopathy   IMPRESSION: Similar appearance of heterogeneous thyroid with right thyroid nodule, which has undergone prior biopsy. Assuming benign result no further specific follow-up would be indicated.   Recommendations follow those established by the new ACR TI-RADS criteria (J Am Coll Radiol A9880051).     Electronically Signed   By: Corrie Mckusick D.O.   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 MNG   Patient with long-standing hypothyroidism, on levothyroxine therapy. On physical exam , patient does not  have neck compression symptoms.  His last thyroid ultrasound was in 2018.    Repeat thyroid ultrasound shows asymmetrical thyroid with right gland measuring 8 cm and left gland measuring 4.7 cm.  He continues to have similar appearance of 4.8 cm thyroid nodule (previously biopsied and determined to be benign), not changing significantly in size.  He does not have compression symptoms  His previsit thyroid function tests are normal, but could be a bit better.  Will increase his Levothyroxine to 88 mcg po daily before breakfast (recently changed from branded-Synthroid to conserve cost).  Will recheck TFTs in 2 months and adjust dose accordingly.  - We discussed about correct intake of levothyroxine, at fasting, with water, separated by at least 30 minutes from breakfast, and separated by more than 4 hours from calcium, iron,  multivitamins, acid reflux medications (PPIs). -Patient is made aware of the fact that thyroid hormone replacement is needed for life, dose to be adjusted by periodic monitoring of thyroid function tests.    I spent  15  minutes in the care of the patient today including review of labs from Thyroid Function, CMP, and other relevant labs ; imaging/biopsy records (current and previous including abstractions from other facilities); face-to-face time discussing  his lab results and symptoms, medications doses, his options of short and long term treatment based on the latest standards of care / guidelines;   and documenting the encounter.  Holli Humbles  participated in the discussions, expressed understanding, and voiced agreement with the above plans.  All questions were answered to his satisfaction. he is encouraged to contact clinic should he have any questions or concerns prior to his return visit.   FOLLOW UP PLAN:  Return in about 8 weeks (around 10/30/2022) for Thyroid follow up, Previsit labs.  Rayetta Pigg, [facility] [address] [place], [place] [address] Phone: [phone] Fax: [phone]  09/04/2022, 3:32 PM

## 2022-10-30 ENCOUNTER — Ambulatory Visit: Payer: PRIVATE HEALTH INSURANCE | Admitting: Nurse Practitioner

## 2022-10-30 DIAGNOSIS — E038 Other specified hypothyroidism: Secondary | ICD-10-CM

## 2022-10-30 DIAGNOSIS — E041 Nontoxic single thyroid nodule: Secondary | ICD-10-CM

## 2022-12-03 ENCOUNTER — Telehealth: Payer: Self-pay | Admitting: *Deleted

## 2022-12-03 NOTE — Telephone Encounter (Signed)
Patient left a voicemail stating that [employer] had reached out to him about a missed appointment and that it was a $50 bill. He states that he knew nothing about this. He is asking for a return call.

## 2022-12-03 NOTE — Telephone Encounter (Signed)
Called pt and advised of the fee. He said he did not know he had an appt. This was patient's 2nd no show. He asked could this be waived. I advised him he had to reach out to billing to contact our Prac Admin to waive fees.

## 2022-12-04 LAB — T4, FREE: Free T4: 1.4 ng/dL (ref 0.82–1.77)

## 2023-04-15 ENCOUNTER — Other Ambulatory Visit: Payer: Self-pay | Admitting: Nurse Practitioner

## 2023-08-15 ENCOUNTER — Other Ambulatory Visit: Payer: Self-pay | Admitting: Nurse Practitioner

## 2023-08-15 DIAGNOSIS — L570 Actinic keratosis: Secondary | ICD-10-CM | POA: Diagnosis not present

## 2023-08-15 DIAGNOSIS — L821 Other seborrheic keratosis: Secondary | ICD-10-CM | POA: Diagnosis not present

## 2023-08-15 DIAGNOSIS — X32XXXA Exposure to sunlight, initial encounter: Secondary | ICD-10-CM | POA: Diagnosis not present

## 2023-08-15 DIAGNOSIS — D225 Melanocytic nevi of trunk: Secondary | ICD-10-CM | POA: Diagnosis not present

## 2023-08-15 DIAGNOSIS — C44619 Basal cell carcinoma of skin of left upper limb, including shoulder: Secondary | ICD-10-CM | POA: Diagnosis not present

## 2023-09-26 DIAGNOSIS — Z08 Encounter for follow-up examination after completed treatment for malignant neoplasm: Secondary | ICD-10-CM | POA: Diagnosis not present

## 2023-09-26 DIAGNOSIS — D485 Neoplasm of uncertain behavior of skin: Secondary | ICD-10-CM | POA: Diagnosis not present

## 2023-09-26 DIAGNOSIS — D2262 Melanocytic nevi of left upper limb, including shoulder: Secondary | ICD-10-CM | POA: Diagnosis not present

## 2023-09-26 DIAGNOSIS — D225 Melanocytic nevi of trunk: Secondary | ICD-10-CM | POA: Diagnosis not present

## 2023-09-26 DIAGNOSIS — Z85828 Personal history of other malignant neoplasm of skin: Secondary | ICD-10-CM | POA: Diagnosis not present

## 2023-09-30 ENCOUNTER — Other Ambulatory Visit: Payer: Self-pay | Admitting: Nurse Practitioner

## 2023-10-03 ENCOUNTER — Other Ambulatory Visit: Payer: Self-pay | Admitting: *Deleted

## 2023-10-03 DIAGNOSIS — E041 Nontoxic single thyroid nodule: Secondary | ICD-10-CM

## 2023-10-03 DIAGNOSIS — E063 Autoimmune thyroiditis: Secondary | ICD-10-CM

## 2023-10-03 MED ORDER — LEVOTHYROXINE SODIUM 88 MCG PO TABS: 88.0000 ug | ORAL_TABLET | Freq: Every day | ORAL | 0 refills | Status: DC

## 2024-01-07 ENCOUNTER — Ambulatory Visit: Payer: PRIVATE HEALTH INSURANCE | Admitting: Nurse Practitioner

## 2024-01-07 DIAGNOSIS — E041 Nontoxic single thyroid nodule: Secondary | ICD-10-CM

## 2024-01-07 DIAGNOSIS — E063 Autoimmune thyroiditis: Secondary | ICD-10-CM

## 2024-02-21 DIAGNOSIS — G4733 Obstructive sleep apnea (adult) (pediatric): Secondary | ICD-10-CM | POA: Diagnosis not present

## 2024-02-26 ENCOUNTER — Telehealth: Payer: Self-pay | Admitting: *Deleted

## 2024-02-26 NOTE — Telephone Encounter (Signed)
 Patient left a message stating that he needed to make appointment. [phone]

## 2024-02-29 ENCOUNTER — Other Ambulatory Visit: Payer: Self-pay | Admitting: Nurse Practitioner

## 2024-02-29 DIAGNOSIS — E041 Nontoxic single thyroid nodule: Secondary | ICD-10-CM

## 2024-02-29 DIAGNOSIS — E063 Autoimmune thyroiditis: Secondary | ICD-10-CM

## 2024-03-02 DIAGNOSIS — E041 Nontoxic single thyroid nodule: Secondary | ICD-10-CM | POA: Diagnosis not present

## 2024-03-02 DIAGNOSIS — Z125 Encounter for screening for malignant neoplasm of prostate: Secondary | ICD-10-CM | POA: Diagnosis not present

## 2024-03-02 DIAGNOSIS — E291 Testicular hypofunction: Secondary | ICD-10-CM | POA: Diagnosis not present

## 2024-03-02 DIAGNOSIS — E063 Autoimmune thyroiditis: Secondary | ICD-10-CM | POA: Diagnosis not present

## 2024-03-02 DIAGNOSIS — Z79899 Other long term (current) drug therapy: Secondary | ICD-10-CM | POA: Diagnosis not present

## 2024-03-02 DIAGNOSIS — E039 Hypothyroidism, unspecified: Secondary | ICD-10-CM | POA: Diagnosis not present

## 2024-03-03 LAB — TSH: TSH: 5.96 u[IU]/mL — ABNORMAL HIGH (ref 0.450–4.500)

## 2024-03-03 LAB — T4, FREE: Free T4: 0.93 ng/dL (ref 0.82–1.77)

## 2024-03-03 NOTE — Telephone Encounter (Signed)
 Pt did labs and was scheduled for 03/12/24

## 2024-03-04 ENCOUNTER — Other Ambulatory Visit: Payer: Self-pay | Admitting: Nurse Practitioner

## 2024-03-04 DIAGNOSIS — E041 Nontoxic single thyroid nodule: Secondary | ICD-10-CM

## 2024-03-04 DIAGNOSIS — E063 Autoimmune thyroiditis: Secondary | ICD-10-CM

## 2024-03-12 ENCOUNTER — Encounter: Payer: Self-pay | Admitting: Nurse Practitioner

## 2024-03-12 ENCOUNTER — Ambulatory Visit: Admitting: Nurse Practitioner

## 2024-03-12 VITALS — BP 122/76 | HR 94 | Ht 70.0 in | Wt 212.8 lb

## 2024-03-12 DIAGNOSIS — E063 Autoimmune thyroiditis: Secondary | ICD-10-CM

## 2024-03-12 DIAGNOSIS — E041 Nontoxic single thyroid nodule: Secondary | ICD-10-CM

## 2024-03-12 MED ORDER — LEVOTHYROXINE SODIUM 100 MCG PO TABS: 100.0000 ug | ORAL_TABLET | Freq: Every day | ORAL | 1 refills | Status: AC

## 2024-03-12 NOTE — Progress Notes (Signed)
 Endocrinology Follow Up Note                                         03/12/2024, 1:38 PM  Subjective:   Subjective    Gordon Haley is a 53 y.o.-year-old male patient being seen in follow up after being seen in consultation for hypothyroidism and MNG referred by Sheryle Carwin, MD.   Past Medical History:  Diagnosis Date   Arthritis    Goiter    Hypothyroidism     Past Surgical History:  Procedure Laterality Date   BACK SURGERY     2011   PILONIDAL CYST EXCISION      Social History   Socioeconomic History   Marital status: Married    Spouse name: Not on file   Number of children: 2   Years of education: Not on file   Highest education level: Not on file  Occupational History   Occupation: Armed forces logistics/support/administrative officer: [employer]    Comment: [employer]  Tobacco Use   Smoking status: Never   Smokeless tobacco: Never  Vaping Use   Vaping status: Never Used  Substance and Sexual Activity   Alcohol use: Yes    Comment: occ couple beer/mo   Drug use: No   Sexual activity: Not on file  Other Topics Concern   Not on file  Social History Narrative   2 children 7/10-healthy   Social Drivers of Corporate investment banker Strain: Not on file  Food Insecurity: Not on file  Transportation Needs: Not on file  Physical Activity: Not on file  Stress: Not on file  Social Connections: Unknown (08/27/2022)   Received from [facility]   Social Network    Social Network: Not on file    Family History  Problem Relation Age of Onset   Cancer Father        bladder   Lung cancer Mother    Diabetes Brother     Outpatient Encounter Medications as of 03/12/2024  Medication Sig   escitalopram (LEXAPRO) 5 MG tablet TK 1 T PO QD   olmesartan (BENICAR) 20 MG tablet Take 20 mg by mouth daily.   [DISCONTINUED] levothyroxine  (SYNTHROID ) 88 MCG tablet TAKE 1 TABLET(88 MCG) BY MOUTH DAILY  BEFORE BREAKFAST   levothyroxine  (SYNTHROID ) 100 MCG tablet Take 1 tablet (100 mcg total) by mouth daily before breakfast.   [DISCONTINUED] aspirin  EC 81 MG tablet Take 1 tablet (81 mg total) by mouth daily. (Patient not taking: Reported on 03/12/2024)   [DISCONTINUED] dicyclomine (BENTYL) 10 MG capsule TK 1 C PO TID (Patient not taking: Reported on 03/12/2024)   [DISCONTINUED] gabapentin (NEURONTIN) 100 MG capsule Take 100 mg by mouth 3 (three) times daily.  (Patient not taking: Reported on 03/12/2024)   No facility-administered encounter medications on file as of 03/12/2024.    ALLERGIES: No Known Allergies VACCINATION STATUS: Immunization History  Administered Date(s) Administered   Influenza,inj,Quad PF,6+ Mos 06/05/2021   Influenza-Unspecified 04/11/2017  Pneumococcal Polysaccharide-23 04/30/2000     HPI   Gordon Haley is a patient with the above medical history. He is a former patient of Dr. Mirna.  He was he was diagnosed with hypothyroidism at approximate age of 24 years, which required subsequent initiation of thyroid  hormone replacement therapy. he was given various doses of branded Synthroid  over the years, currently on 88 micrograms. he reports compliance to this medication:  Taking it daily on empty stomach with water, separated by >30 minutes before breakfast and other medications, and by at least 4 hours from calcium, iron, PPIs, multivitamins.  He was also diagnosed with MNG.  His last thyroid  ultrasound was in 2018 showing stable right mid thyroid  nodule measuring 5 cm.  This nodule was previously biopsied and was negative for malignancy.  I reviewed patient's thyroid  tests:  Lab Results  Component Value Date   TSH 5.960 (H) 03/02/2024   TSH 3.760 08/02/2022   TSH 3.57 05/29/2022   TSH 4.37 12/19/2021   FREET4 0.93 03/02/2024   FREET4 1.40 12/03/2022   FREET4 1.10 08/02/2022     Pt denies hoarseness, dysphagia/odynophagia, SOB with lying down.  He does feel  nodule in right side of neck (noticed this prior to his initial diagnosis when he was shaving).  He was diagnosed with OSA on bipap.  he does have extensive family history of thyroid  disorders.  No family history of thyroid  cancer.  No history of radiation therapy to head or neck.  No recent use of iodine supplements.  Denies use of Biotin containing supplements.  I reviewed his chart and he also has a history of OSA and low testosterone.   Review of systems  Constitutional: + slowly increasing body weight,  current Body mass index is 30.53 kg/m. ,+ fatigue, no subjective hyperthermia, no subjective hypothermia Eyes: no blurry vision, no xerophthalmia ENT: no sore throat, no nodules palpated in throat, no dysphagia/odynophagia, no hoarseness Cardiovascular: no chest pain, no shortness of breath, no palpitations, no leg swelling Respiratory: no cough, no shortness of breath Gastrointestinal: no nausea/vomiting/diarrhea Musculoskeletal: no muscle/joint aches Skin: no rashes, no hyperemia Neurological: no tremors, no numbness, no tingling, no dizziness Psychiatric: no depression, no anxiety   Objective:   Objective     BP 122/76 (BP Location: Left Arm, Patient Position: Sitting, Cuff Size: Large)   Pulse 94   Ht 5' 10 (1.778 m)   Wt 212 lb 12.8 oz (96.5 kg)   BMI 30.53 kg/m  Wt Readings from Last 3 Encounters:  03/12/24 212 lb 12.8 oz (96.5 kg)  09/04/22 210 lb 6.4 oz (95.4 kg)  08/02/22 209 lb 12.8 oz (95.2 kg)    BP Readings from Last 3 Encounters:  03/12/24 122/76  09/04/22 131/85  08/02/22 119/80      Physical Exam- Limited  Constitutional:  Body mass index is 30.53 kg/m. , not in acute distress, normal state of mind Eyes:  EOMI, no exophthalmos Musculoskeletal: no gross deformities, strength intact in all four extremities, no gross restriction of joint movements Skin:  no rashes, no hyperemia Neurological: no tremor with outstretched hands   CMP ( most  recent) CMP     Component Value Date/Time   NA 139 09/30/2016 2031   K 3.5 09/30/2016 2031   CL 102 09/30/2016 2031   CO2 28 09/30/2016 2031   GLUCOSE 109 (H) 09/30/2016 2031   BUN 14 09/30/2016 2031   CREATININE 0.80 09/30/2016 2031   CALCIUM 9.2 09/30/2016 2031   PROT 7.4  09/30/2016 2031   ALBUMIN 4.2 09/30/2016 2031   AST 24 09/30/2016 2031   ALT 24 09/30/2016 2031   ALKPHOS 51 09/30/2016 2031   BILITOT 0.9 09/30/2016 2031   GFRNONAA >60 09/30/2016 2031   GFRAA >60 09/30/2016 2031     Diabetic Labs (most recent): No results found for: HGBA1C, MICROALBUR   Lipid Panel ( most recent) Lipid Panel  No results found for: CHOL, TRIG, HDL, CHOLHDL, VLDL, LDLCALC, LDLDIRECT, LABVLDL     Lab Results  Component Value Date   TSH 5.960 (H) 03/02/2024   TSH 3.760 08/02/2022   TSH 3.57 05/29/2022   TSH 4.37 12/19/2021   FREET4 0.93 03/02/2024   FREET4 1.40 12/03/2022   FREET4 1.10 08/02/2022     Thyroid  US  from 08/20/22 CLINICAL DATA:  53 year old male with multinodular thyroid    EXAM: THYROID  ULTRASOUND   TECHNIQUE: Ultrasound examination of the thyroid  gland and adjacent soft tissues was performed.   COMPARISON:  04/22/2017   Prior biopsy of right thyroid  nodule 03/05/2013   FINDINGS: Parenchymal Echotexture: Markedly heterogenous   Isthmus: 0.7 cm   Right lobe: 8.0 cm x 2.7 cm x 3.3 cm   Left lobe: 4.7 cm x 1.6 cm x 1.7 cm   _________________________________________________________   Estimated total number of nodules >/= 1 cm: 1   Number of spongiform nodules >/=  2 cm not described below (TR1): 0   Number of mixed cystic and solid nodules >/= 1.5 cm not described below (TR2): 0   _________________________________________________________   Right inferior thyroid  nodule labeled 1 is relatively unchanged, 4.8 cm and previously 5.0 cm. This nodule has undergone prior biopsy 03/05/2013. Assuming benign result, no further specific  follow-up would be indicated.   The nodule labeled 2 in the mid left thyroid  measures 6 mm and does not meet criteria for surveillance.   No adenopathy   IMPRESSION: Similar appearance of heterogeneous thyroid  with right thyroid  nodule, which has undergone prior biopsy. Assuming benign result no further specific follow-up would be indicated.   Recommendations follow those established by the new ACR TI-RADS criteria (J Am Coll Radiol 2017;14:587-595).     Electronically Signed   By: Ami Bellman D.O.   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 MNG  Patient with long-standing hypothyroidism, on levothyroxine  therapy. On physical exam , patient does not  have neck compression symptoms.  His last thyroid  ultrasound was in 2018.    Repeat thyroid  ultrasound shows asymmetrical thyroid  with right gland measuring 8 cm and left gland measuring 4.7 cm.  He continues to have similar appearance of 4.8 cm thyroid  nodule (previously biopsied and determined to be benign), not changing significantly in size.  He does not have compression symptoms, will not need additional imaging unless new developments arise.  His previsit TFTs are consistent with slight under-replacement.  He is advised to increase Levothyroxine  to 100 mcg po daily before breakfast.  Will recheck TFTs in 3 months and reassess.  - We discussed about correct intake of levothyroxine , at fasting, with water, separated by at least 30 minutes from breakfast, and separated by more than 4 hours from calcium, iron,  multivitamins, acid reflux medications (PPIs). -Patient is made aware of the fact that thyroid  hormone  replacement is needed for life, dose to be adjusted by periodic monitoring of thyroid  function tests.     I spent  34  minutes in the care of the patient today including review of labs from Thyroid  Function, CMP, and other relevant labs ; imaging/biopsy records (current and previous including abstractions from other facilities); face-to-face time discussing  his lab results and symptoms, medications doses, his options of short and long term treatment based on the latest standards of care / guidelines;   and documenting the encounter.  Gordon Haley  participated in the discussions, expressed understanding, and voiced agreement with the above plans.  All questions were answered to his satisfaction. he is encouraged to contact clinic should he have any questions or concerns prior to his return visit.   FOLLOW UP PLAN:  Return in about 3 months (around 06/11/2024) for Thyroid  follow up, Previsit labs.  Gordon Haley, [facility] [address] [place], [place] [address] Phone: [phone] Fax: [phone]  03/12/2024, 1:38 PM

## 2024-03-12 NOTE — Patient Instructions (Signed)

## 2024-04-29 DIAGNOSIS — Z79899 Other long term (current) drug therapy: Secondary | ICD-10-CM | POA: Diagnosis not present

## 2024-04-29 DIAGNOSIS — E291 Testicular hypofunction: Secondary | ICD-10-CM | POA: Diagnosis not present

## 2024-05-07 DIAGNOSIS — I10 Essential (primary) hypertension: Secondary | ICD-10-CM | POA: Diagnosis not present

## 2024-05-07 DIAGNOSIS — F410 Panic disorder [episodic paroxysmal anxiety] without agoraphobia: Secondary | ICD-10-CM | POA: Diagnosis not present

## 2024-05-07 DIAGNOSIS — E030 Congenital hypothyroidism with diffuse goiter: Secondary | ICD-10-CM | POA: Diagnosis not present

## 2024-05-07 DIAGNOSIS — Z0001 Encounter for general adult medical examination with abnormal findings: Secondary | ICD-10-CM | POA: Diagnosis not present

## 2024-06-22 ENCOUNTER — Ambulatory Visit: Admitting: Nurse Practitioner

## 2024-06-22 DIAGNOSIS — E063 Autoimmune thyroiditis: Secondary | ICD-10-CM

## 2024-06-23 ENCOUNTER — Telehealth: Payer: Self-pay | Admitting: *Deleted

## 2024-06-23 NOTE — Telephone Encounter (Signed)
 Patient left a VM on 06/22/24 asking for a call back. I called the patient back and his VM box is full cannot leave any messages.

## 2024-07-22 LAB — T4, FREE: Free T4: 1.37 ng/dL (ref 0.82–1.77)

## 2024-07-22 LAB — TSH: TSH: 2.12 u[IU]/mL (ref 0.450–4.500)

## 2024-08-21 ENCOUNTER — Ambulatory Visit: Admitting: Nurse Practitioner
